# Patient Record
Sex: Female | Born: 1955
Health system: Southern US, Community
[De-identification: ages and names within clinical notes are randomized; demographics above are authoritative.]

## PROBLEM LIST (undated history)

## (undated) DIAGNOSIS — R112 Nausea with vomiting, unspecified: Secondary | ICD-10-CM

## (undated) DIAGNOSIS — Z973 Presence of spectacles and contact lenses: Secondary | ICD-10-CM

## (undated) DIAGNOSIS — C50919 Malignant neoplasm of unspecified site of unspecified female breast: Secondary | ICD-10-CM

## (undated) DIAGNOSIS — Z972 Presence of dental prosthetic device (complete) (partial): Secondary | ICD-10-CM

## (undated) DIAGNOSIS — Z9889 Other specified postprocedural states: Secondary | ICD-10-CM

## (undated) DIAGNOSIS — Z923 Personal history of irradiation: Secondary | ICD-10-CM

## (undated) HISTORY — DX: Malignant neoplasm of unspecified site of unspecified female breast: C50.919

## (undated) HISTORY — PX: MULTIPLE TOOTH EXTRACTIONS: SHX2053

---

## 2000-02-13 ENCOUNTER — Other Ambulatory Visit: Admission: RE | Admit: 2000-02-13 | Discharge: 2000-02-13 | Payer: Self-pay | Admitting: *Deleted

## 2000-02-14 ENCOUNTER — Encounter (INDEPENDENT_AMBULATORY_CARE_PROVIDER_SITE_OTHER): Payer: Self-pay | Admitting: Specialist

## 2000-02-14 ENCOUNTER — Other Ambulatory Visit: Admission: RE | Admit: 2000-02-14 | Discharge: 2000-02-14 | Payer: Self-pay | Admitting: *Deleted

## 2000-07-23 ENCOUNTER — Encounter: Payer: Self-pay | Admitting: Internal Medicine

## 2000-07-23 ENCOUNTER — Encounter: Admission: RE | Admit: 2000-07-23 | Discharge: 2000-07-23 | Payer: Self-pay | Admitting: Internal Medicine

## 2004-02-15 ENCOUNTER — Encounter: Admission: RE | Admit: 2004-02-15 | Discharge: 2004-02-15 | Payer: Self-pay | Admitting: Internal Medicine

## 2009-08-20 ENCOUNTER — Encounter: Admission: RE | Admit: 2009-08-20 | Discharge: 2009-08-20 | Payer: Self-pay | Admitting: Obstetrics & Gynecology

## 2010-07-27 ENCOUNTER — Other Ambulatory Visit: Payer: Self-pay | Admitting: Family Medicine

## 2010-07-27 DIAGNOSIS — Z1231 Encounter for screening mammogram for malignant neoplasm of breast: Secondary | ICD-10-CM

## 2010-08-24 ENCOUNTER — Ambulatory Visit
Admission: RE | Admit: 2010-08-24 | Discharge: 2010-08-24 | Disposition: A | Discharge status: Home / Self Care | Payer: 59 | Source: Ambulatory Visit | Attending: Family Medicine | Admitting: Family Medicine

## 2010-08-24 DIAGNOSIS — Z1231 Encounter for screening mammogram for malignant neoplasm of breast: Secondary | ICD-10-CM

## 2011-09-20 ENCOUNTER — Other Ambulatory Visit: Payer: Self-pay | Admitting: Family Medicine

## 2011-09-20 DIAGNOSIS — Z1231 Encounter for screening mammogram for malignant neoplasm of breast: Secondary | ICD-10-CM

## 2011-10-04 ENCOUNTER — Ambulatory Visit
Admission: RE | Admit: 2011-10-04 | Discharge: 2011-10-04 | Disposition: A | Discharge status: Home / Self Care | Payer: 59 | Source: Ambulatory Visit | Attending: Family Medicine | Admitting: Family Medicine

## 2011-10-04 DIAGNOSIS — Z1231 Encounter for screening mammogram for malignant neoplasm of breast: Secondary | ICD-10-CM

## 2012-10-03 ENCOUNTER — Other Ambulatory Visit: Payer: Self-pay

## 2012-10-03 DIAGNOSIS — Z1231 Encounter for screening mammogram for malignant neoplasm of breast: Secondary | ICD-10-CM

## 2012-10-23 ENCOUNTER — Ambulatory Visit
Admission: RE | Admit: 2012-10-23 | Discharge: 2012-10-23 | Disposition: A | Discharge status: Home / Self Care | Payer: 59 | Source: Ambulatory Visit

## 2012-10-23 DIAGNOSIS — Z1231 Encounter for screening mammogram for malignant neoplasm of breast: Secondary | ICD-10-CM

## 2013-04-16 HISTORY — PX: ORIF WRIST FRACTURE: SHX2133

## 2013-10-10 ENCOUNTER — Other Ambulatory Visit: Payer: Self-pay

## 2013-10-10 DIAGNOSIS — Z1231 Encounter for screening mammogram for malignant neoplasm of breast: Secondary | ICD-10-CM

## 2013-10-24 ENCOUNTER — Ambulatory Visit
Admission: RE | Admit: 2013-10-24 | Discharge: 2013-10-24 | Disposition: A | Discharge status: Home / Self Care | Payer: 59 | Source: Ambulatory Visit

## 2013-10-24 DIAGNOSIS — Z1231 Encounter for screening mammogram for malignant neoplasm of breast: Secondary | ICD-10-CM

## 2013-10-27 ENCOUNTER — Other Ambulatory Visit: Payer: Self-pay | Admitting: Family Medicine

## 2013-10-27 DIAGNOSIS — R928 Other abnormal and inconclusive findings on diagnostic imaging of breast: Secondary | ICD-10-CM

## 2013-11-14 ENCOUNTER — Ambulatory Visit
Admission: RE | Admit: 2013-11-14 | Discharge: 2013-11-14 | Disposition: A | Discharge status: Home / Self Care | Payer: 59 | Source: Ambulatory Visit | Attending: Family Medicine | Admitting: Family Medicine

## 2013-11-14 ENCOUNTER — Other Ambulatory Visit: Payer: Self-pay | Admitting: Family Medicine

## 2013-11-14 DIAGNOSIS — R928 Other abnormal and inconclusive findings on diagnostic imaging of breast: Secondary | ICD-10-CM

## 2013-11-24 ENCOUNTER — Ambulatory Visit
Admission: RE | Admit: 2013-11-24 | Discharge: 2013-11-24 | Disposition: A | Discharge status: Home / Self Care | Payer: 59 | Source: Ambulatory Visit | Attending: Family Medicine | Admitting: Family Medicine

## 2013-11-24 DIAGNOSIS — R928 Other abnormal and inconclusive findings on diagnostic imaging of breast: Secondary | ICD-10-CM

## 2013-11-24 HISTORY — PX: BREAST BIOPSY: SHX20

## 2013-11-25 ENCOUNTER — Other Ambulatory Visit: Payer: Self-pay | Admitting: Family Medicine

## 2013-11-25 DIAGNOSIS — C50912 Malignant neoplasm of unspecified site of left female breast: Secondary | ICD-10-CM

## 2013-11-26 ENCOUNTER — Telehealth: Payer: Self-pay | Admitting: *Deleted

## 2013-11-26 DIAGNOSIS — C50112 Malignant neoplasm of central portion of left female breast: Secondary | ICD-10-CM | POA: Insufficient documentation

## 2013-11-26 DIAGNOSIS — C50412 Malignant neoplasm of upper-outer quadrant of left female breast: Secondary | ICD-10-CM

## 2013-11-26 NOTE — Telephone Encounter (Signed)
Confirmed [facility] for 12/03/13 at 8am .  Instructions and contact information given.

## 2013-12-01 ENCOUNTER — Ambulatory Visit
Admission: RE | Admit: 2013-12-01 | Discharge: 2013-12-01 | Disposition: A | Discharge status: Home / Self Care | Payer: 59 | Source: Ambulatory Visit | Attending: Family Medicine | Admitting: Family Medicine

## 2013-12-01 DIAGNOSIS — C50912 Malignant neoplasm of unspecified site of left female breast: Secondary | ICD-10-CM

## 2013-12-01 MED ORDER — GADOBENATE DIMEGLUMINE 529 MG/ML IV SOLN
10.0000 mL | Freq: Once | INTRAVENOUS | Status: AC | PRN
  Administered 2013-12-01: 10 mL via INTRAVENOUS

## 2013-12-03 ENCOUNTER — Encounter: Payer: Self-pay | Admitting: *Deleted

## 2013-12-03 ENCOUNTER — Ambulatory Visit: Payer: 59

## 2013-12-03 ENCOUNTER — Encounter: Payer: Self-pay | Admitting: Hematology and Oncology

## 2013-12-03 ENCOUNTER — Ambulatory Visit (INDEPENDENT_AMBULATORY_CARE_PROVIDER_SITE_OTHER): Payer: Self-pay | Admitting: Surgery

## 2013-12-03 ENCOUNTER — Encounter: Payer: Self-pay | Admitting: Dietician

## 2013-12-03 ENCOUNTER — Encounter (INDEPENDENT_AMBULATORY_CARE_PROVIDER_SITE_OTHER): Payer: Self-pay

## 2013-12-03 ENCOUNTER — Ambulatory Visit: Payer: 59 | Admitting: Physical Therapy

## 2013-12-03 ENCOUNTER — Other Ambulatory Visit (HOSPITAL_BASED_OUTPATIENT_CLINIC_OR_DEPARTMENT_OTHER): Payer: 59

## 2013-12-03 ENCOUNTER — Ambulatory Visit (HOSPITAL_BASED_OUTPATIENT_CLINIC_OR_DEPARTMENT_OTHER): Payer: 59 | Admitting: Hematology and Oncology

## 2013-12-03 ENCOUNTER — Ambulatory Visit
Admission: RE | Admit: 2013-12-03 | Discharge: 2013-12-03 | Disposition: A | Discharge status: Home / Self Care | Payer: 59 | Source: Ambulatory Visit | Attending: Radiation Oncology | Admitting: Radiation Oncology

## 2013-12-03 VITALS — BP 149/72 | HR 80 | Temp 98.5°F | Resp 18 | Ht 59.0 in | Wt 119.5 lb

## 2013-12-03 DIAGNOSIS — C50112 Malignant neoplasm of central portion of left female breast: Secondary | ICD-10-CM

## 2013-12-03 DIAGNOSIS — C50912 Malignant neoplasm of unspecified site of left female breast: Secondary | ICD-10-CM

## 2013-12-03 DIAGNOSIS — D0512 Intraductal carcinoma in situ of left breast: Secondary | ICD-10-CM

## 2013-12-03 DIAGNOSIS — Z171 Estrogen receptor negative status [ER-]: Secondary | ICD-10-CM

## 2013-12-03 DIAGNOSIS — C50412 Malignant neoplasm of upper-outer quadrant of left female breast: Secondary | ICD-10-CM

## 2013-12-03 LAB — CBC WITH DIFFERENTIAL/PLATELET
BASO%: 0.7 % (ref 0.0–2.0)
Basophils Absolute: 0 10*3/uL (ref 0.0–0.1)
EOS ABS: 0.1 10*3/uL (ref 0.0–0.5)
EOS%: 3.3 % (ref 0.0–7.0)
HCT: 43.3 % (ref 34.8–46.6)
HGB: 14 g/dL (ref 11.6–15.9)
LYMPH%: 35.8 % (ref 14.0–49.7)
MCH: 27.1 pg (ref 25.1–34.0)
MCHC: 32.2 g/dL (ref 31.5–36.0)
MCV: 84 fL (ref 79.5–101.0)
MONO#: 0.4 10*3/uL (ref 0.1–0.9)
MONO%: 8.6 % (ref 0.0–14.0)
NEUT%: 51.6 % (ref 38.4–76.8)
NEUTROS ABS: 2.1 10*3/uL (ref 1.5–6.5)
PLATELETS: 247 10*3/uL (ref 145–400)
RBC: 5.16 10*6/uL (ref 3.70–5.45)
RDW: 14 % (ref 11.2–14.5)
WBC: 4.2 10*3/uL (ref 3.9–10.3)
lymph#: 1.5 10*3/uL (ref 0.9–3.3)

## 2013-12-03 LAB — COMPREHENSIVE METABOLIC PANEL (CC13)
ALBUMIN: 4 g/dL (ref 3.5–5.0)
ALK PHOS: 107 U/L (ref 40–150)
ALT: 13 U/L (ref 0–55)
AST: 16 U/L (ref 5–34)
Anion Gap: 9 mEq/L (ref 3–11)
BILIRUBIN TOTAL: 0.47 mg/dL (ref 0.20–1.20)
BUN: 15.3 mg/dL (ref 7.0–26.0)
CO2: 27 mEq/L (ref 22–29)
Calcium: 9.6 mg/dL (ref 8.4–10.4)
Chloride: 107 mEq/L (ref 98–109)
Creatinine: 0.8 mg/dL (ref 0.6–1.1)
Glucose: 153 mg/dl — ABNORMAL HIGH (ref 70–140)
POTASSIUM: 3.8 meq/L (ref 3.5–5.1)
SODIUM: 142 meq/L (ref 136–145)
TOTAL PROTEIN: 7.2 g/dL (ref 6.4–8.3)

## 2013-12-03 NOTE — Progress Notes (Addendum)
  Ms. Boettner is a very pleasant 58 y.o. 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 Biopsy results also revealed pathology indicating the tumor is ER negative and PR negative.   She presents today with her friends to the [facility]) for treatment consideration and recommendations from the breast surgeon, radiation oncologist, and medical oncologist.   I briefly met with Ms. Steines and her friends during her [facility] visit today. We discussed the purpose of the Survivorship Clinic, which will include monitoring for recurrence, coordinating completion of age and gender-appropriate cancer screenings, promotion of overall wellness, as well as managing potential late/long-term side effects of anti-cancer treatments.   As of today, the treatment plan for Ms. Salmons will likely include surgery and radiation therapy. She will also meet with the [employer]. Endocrine therapy will not be offered for Ms. Mungin due to the tumor's ER/PR status.  The intent of treatment for Ms. [place] is cure, therefore she is will be eligible for the Survivorship Clinic upon her completion of treatment. Her survivorship care plan (SCP) document will be drafted and updated throughout the course of her treatment trajectory. She will receive the SCP in an office visit with myself in the Survivorship Clinic once she has completed treatment.   Ms. Strnad was encouraged to ask questions and all questions were answered to her satisfaction. She was given my business card and encouraged to contact me with any concerns regarding survivorship. I look forward to participating in her care.  Mike Craze, NP

## 2013-12-03 NOTE — Progress Notes (Signed)
[facility] NEW PATIENT EVALUATION  Name: Kathy Sherman MRN: [id number]  Date:   12/03/2013           DOB: [birth year]  Status: outpatient   CC: Kathy Brome, MD  Kathy Overall, MD    REFERRING PHYSICIAN: Alphonsa Overall, MD   DIAGNOSIS: Stage 0 (Tis N0 M0) DCIS of the left breast   HISTORY OF PRESENT ILLNESS:  Kathy Sherman is a 58 y.o. female who is seen today at the multidisciplinary breast clinic through the courtesy of Dr. Lucia Sherman for evaluation of her DCIS of the left breast. At the time of a screening tomo mammogram at the Kathy [facility] on October 9 she was seen to have new left breast calcifications. Follow-up mammography on October 30 showed a 5 x 8 x 20 mm area of calcifications within the upper retroareolar region. Biopsy on November 9 was diagnostic for high-grade DCIS with calcifications and necrosis. Her DCIS was ER and PR negative. Breast MR on November 16 showed non-mass like enhancement over an area of 1.3 x 2.2 x 1.7 cm within the upper central breast. She is without complaints today.  PREVIOUS RADIATION THERAPY: No   PAST MEDICAL HISTORY:    PAST SURGICAL HISTORY: No past surgical history on file.   FAMILY HISTORY: Her father died from a GI bleed at age 21. Her mother died from "old age" at 64. No family history of breast cancer.   SOCIAL HISTORY:  reports that she has never smoked. She does not have any smokeless tobacco history on file. She reports that she does not drink alcohol or use illicit drugs. Married, one son age 16. She works as a Health and safety inspector.   ALLERGIES: Review of patient's allergies indicates not on file.   MEDICATIONS:  Current Outpatient Prescriptions  Medication Sig Dispense Refill  . ferrous sulfate 325 (65 FE) MG tablet Take 325 mg by mouth daily with breakfast.     No current facility-administered medications for this encounter.     REVIEW OF SYSTEMS:  Pertinent items are noted in HPI.    PHYSICAL  EXAM: Alert and oriented female appearing her stated age.   Wt Readings from Last 3 Encounters:  12/03/13 119 lb 8 oz (54.205 kg)   Temp Readings from Last 3 Encounters:  12/03/13 98.5 F (36.9 C) Oral   BP Readings from Last 3 Encounters:  12/03/13 149/72   Pulse Readings from Last 3 Encounters:  12/03/13 80   Head and neck examination: Grossly unremarkable. Nodes: Without palpable cervical, supraclavicular, or axillary lymphadenopathy. Chest: Lungs clear. Breasts: There is a punctate biopsy wound at 12:00 along the left breast with surrounding ecchymosis. There is mild induration along her biopsy site. Right breast without masses or lesions. Abdomen without hepatomegaly. Extremities: Without edema.    LABORATORY DATA:  Lab Results  Component Value Date   WBC 4.2 12/03/2013   HGB 14.0 12/03/2013   HCT 43.3 12/03/2013   MCV 84.0 12/03/2013   PLT 247 12/03/2013   Lab Results  Component Value Date   NA 142 12/03/2013   K 3.8 12/03/2013   CO2 27 12/03/2013   Lab Results  Component Value Date   ALT 13 12/03/2013   AST 16 12/03/2013   ALKPHOS 107 12/03/2013   BILITOT 0.47 12/03/2013      IMPRESSION: Stage 0 (Tis N0 M0) DCIS of the left breast. Her prognosis is excellent.  I explained to the patient and her close friends that her  local treatment options include mastectomy versus partial mastectomy followed by radiation therapy. We discussed the potential acute and late toxicities of radiation therapy. I would obtain a baseline preradiation mammogram postoperatively. She would be a candidate for deep inspiration breath-hold technology to avoid cardiac irradiation. We can also discuss hypofractionated treatment although there is some concern about treatment of high-grade disease with a hypofractionated regimen.   PLAN: As discussed above. I can see her postoperatively.  I spent 40 minutes face to face with the patient and more than 50% of that time was spent in counseling  and/or coordination of care.

## 2013-12-03 NOTE — Progress Notes (Signed)
[facility] Psychosocial Distress Screening Clinical Social Work  Patient completed distress screening protocol and scored a 5 on the Psychosocial Distress Thermometer which indicates moderate distress. Clinical Social Worker met with patient and patients son in [employer] to assess for distress and other psychosocial needs.  Patient stated she felt "better" after meeting with the treatment team and getting information on her treatment plan.  Patient also requested information on Advance Directives.  CSW and patient reviewed AD packet and patient plans to contact CSW when she is prepared to complete.  CSW informed patient of the support team and support services at [facility], and encouraged patient to call with any questions or concerns.      ONCBCN DISTRESS SCREENING 12/03/2013  Screening Type Initial Screening  Distress experienced in past week (1-10) 5  Practical problem type Transportation;Insurance  Family Problem type Other (comment)  Emotional problem type Adjusting to illness  Information Concerns Type Lack of info about diagnosis;Lack of info about treatment;Lack of info about complementary therapy choices;Lack of info about maintaining fitness  Physician notified of physical symptoms Yes  Referral to clinical social work Yes    Johnnye Lana, MSW, LCSW, OSW-C Clinical Social Worker [facility] [phone]

## 2013-12-03 NOTE — Progress Notes (Unsigned)
Patient was seen by RD during [facility] on 12/03/2013  Provided pt with folder of educational materials regarding general nutrition recommendations for breast cancer patients, plant-based diets, antioxidants, cancer facts vs myths, and information on organic foods  Explained importance of healthy nutrition during treatments and encouraged pt to consume daily recommended amount of fruits and vegetables, emphasizing variety of intake for maximum antioxidant and synergistic health benefits. Promoted adequate fiber intake, with use of whole grain and whole wheat products, beans, and lentils. Encouraged patient to follow a low fat diet with use of heart healthy fats, and to opt for plant-based proteins weekly  Recommended pt maintain healthy weight during treatments, and encouraged gradual weight loss as warranted after procedures.  Diet recall indicated pt consuming largely [place] based diet; eats variety of fruits, vegetables, fish, chicken with minimal intake of red meats or processed foods. Pt does majority of cooking at home, and rarely goes out to eat for meals. Recommended pt increase intake of plant based proteins, as well as continued intake of lean animal proteins  Patient had questions regarding organic vs non-organic foods. Explained to pt the benefits of organic, and theories as to why organic may contain higher antioxidants and phytochemicals; both of which could be helpful for pt's immune system. Recommended pt refer to "Dirty Dozen List" for purchasing guidelines.  Expect good compliance. Pt complying with general healthy nutrition prior to RD education. Pt's family was also present during education and seemed to be a very good support system.   Provided pt with outpatient oncology RD contact information. Encouraged pt to contact RD with additional follow up questions or nutrition-related concerns.  Atlee Abide MS RD LDN Clinical Dietitian EXNTZ:[phone]

## 2013-12-03 NOTE — Consult Note (Signed)
Kathy Sherman 12/03/2013 8:20 AM Location: [facility] Patient #: [id number] DOB: May 06, 1955 Undefined / Language: Undefined / Race: Undefined Female  History of Present Illness   The patient is a 58 year old female who presents with breast cancer. Her PCP is Dr. Leonarda Sherman, [place]. She lives in [place].  She is in the [facility]. Dr. Lindi Sherman and Kathy Sherman are her treating oncologist.  She is accompanied with Kathy [place] and Kathy Sherman (both female friends). Her husband is in the [facility] ICU after a biopsy of his renal transplant.  Her back ground is Kathy Sherman. I asked her how she got here - meaning how they found her breast cancer. She interpreted it as how she got from the [place] to the [place].  She gets annual mammogram, last one was about Sept 2014. She had a mammogram on 10/24/2013 at [place] that showed a left breast microcalcifications. On 11/14/2013 she had a mammogram that showed 5 x 8 x 20 mm loose group of heterogeneous calcifications. A left breast biopsy on 11/24/2013 [phone]) showed DCIS, ER - 0, PR - 0.  She has no family history of breast cancer. She is not on hormone meds. Her last period was about 6 years ago.  Her MRI on 12/01/2013 showed a 2.2 cm area of enhancement in the upper central portion of the left breast. She had an incidental right renal cyst (2.0 cm).  I discussed the options for breast cancer treatment with the patient. She is in the [facility] and is seeing medical oncology and radiation oncology. I discussed the surgical options of lumpectomy vs. mastectomy. If mastectomy, there is the possibility of reconstruction. I discussed the options of lymph node biopsy. The treatment plan depends on the pathologic staging of the tumor and the patient's personal wishes. The risks of surgery include, but are not limited to, bleeding, infection, the need for further surgery, and nerve injury. The patient has been  given literature on the treatment of breast cancer.  Plan: 1) Left breast lumpectomy (seed localization), 2) radiation therapy  Past medical history: She fractured her left wrist in April 2015. Required surgery by Dr. Celesta Sherman in [place].  Social History: Married. Husband in ICU at [facility]. He has had a renal transplant. She has one son, age 34, at [employer]. 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  Other Problems Kathy Sherman; 12/03/2013 8:21 AM) Breast Cancer General anesthesia - complications Hypercholesterolemia Lump In Breast  Past Surgical History Kathy Sherman; 12/03/2013 8:21 AM) Breast Biopsy Left. Oral Surgery  Diagnostic Studies History Kathy Sherman; 12/03/2013 8:21 AM) Colonoscopy never Mammogram within last year Pap Smear 1-5 years ago  Social History Kathy Sherman; 12/03/2013 8:21 AM) Caffeine use Carbonated beverages, Coffee. No alcohol use No drug use Tobacco use Never smoker.  Family History Kathy Sherman; 12/03/2013 8:21 AM) Bleeding disorder Father. Hypertension Brother.  Pregnancy / Birth History Kathy Sherman; 12/03/2013 8:21 AM) Age at menarche 64 years. Age of menopause 3-55 Gravida 1 Irregular periods Maternal age 68-35 Para 1  Review of Systems Kathy Sherman; 12/03/2013 8:21 AM) General Present- Weight Loss. Not Present- Appetite Loss, Chills, Fatigue, Fever, Night Sweats and Weight Gain. Skin Not Present- Change in Wart/Mole, Dryness, Hives, Jaundice, New Lesions, Non-Healing Wounds, Rash and Ulcer. HEENT Present- Seasonal Allergies and Wears glasses/contact lenses. Not Present- Earache, Hearing Loss, Hoarseness, Nose Bleed, Oral Ulcers, Ringing in the Ears, Sinus Pain, Sore Throat, Visual Disturbances and Yellow Eyes. Respiratory Present- Snoring.  Not Present- Bloody sputum, Chronic Cough, Difficulty Breathing and Wheezing. Breast Not Present- Breast Mass, Breast Pain, Nipple  Discharge and Skin Changes. Cardiovascular Not Present- Chest Pain, Difficulty Breathing Lying Down, Leg Cramps, Palpitations, Rapid Heart Rate, Shortness of Breath and Swelling of Extremities. Gastrointestinal Not Present- Abdominal Pain, Bloating, Bloody Stool, Change in Bowel Habits, Chronic diarrhea, Constipation, Difficulty Swallowing, Excessive gas, Gets full quickly at meals, Hemorrhoids, Indigestion, Nausea, Rectal Pain and Vomiting. Female Genitourinary Not Present- Frequency, Nocturia, Painful Urination, Pelvic Pain and Urgency. Musculoskeletal Not Present- Back Pain, Joint Pain, Joint Stiffness, Muscle Pain, Muscle Weakness and Swelling of Extremities. Neurological Not Present- Decreased Memory, Fainting, Headaches, Numbness, Seizures, Tingling, Tremor, Trouble walking and Weakness. Psychiatric Not Present- Anxiety, Bipolar, Change in Sleep Pattern, Depression, Fearful and Frequent crying. Endocrine Not Present- Cold Intolerance, Excessive Hunger, Hair Changes, Heat Intolerance, Hot flashes and New Diabetes. Hematology Not Present- Easy Bruising, Excessive bleeding, Gland problems, HIV and Persistent Infections.  Physical Exam: Note:General: WN Kathy Sherman F alert and generally healthy appearing. HEENT: Normal. Pupils equal. Good dentition.  Neck: Supple. No mass. No thyroid mass.  Lymph Nodes: No supraclavicular, cervical, or axillary nodes.  Lungs: Clear to auscultation and symmetric breath sounds. Heart: RRR. No murmur or rub.  Breasts: Right - No mass or nipple discharge Left - Bruise at 12 o'clock. Minimal mass effect, probably from buise. No nipple discharge.  Abdomen: Soft. No mass. No tenderness. No hernia. Normal bowel sounds. No abdominal scars. Rectal: Not done.  Extremities: Good strength and ROM in upper and lower extremities. Scar left wrist from surgery.  Neurologic: Grossly intact to motor and sensory function. Psychiatric: Has normal mood and  affect. Behavior is normal.   Assessment & Plan: DCIS (DUCTAL CARCINOMA IN SITU) OF BREAST, LEFT (233.0  D05.12) Story: Microca++ at 12 o'clock  Left breast biopsy on 11/24/2013 ([id number]) showed DCIS, ER - 0, PR - 0. Impression: Plan: 1) Seed localization left breast lumpectomy, 2) Radiation tx  Current Plans:  Schedule for Surgery  Alphonsa Overall, MD, [facility] Pager: [phone] Office phone:  [phone]

## 2013-12-03 NOTE — Progress Notes (Deleted)
Ms. Hild is a very pleasant 58 y.o.Marland Kitchen female from [place], [place] with newly diagnosed grade 3 invasive ductal carcinoma in situ of the left breast.  Biopsy results also revealed pathology indicating the tumor is ER negative and PR negative.   She presents today with her family to the [facility]) for treatment consideration and recommendations from the breast surgeon, radiation oncologist, and medical oncologist.     I briefly met with Ms. Pardi and her family during her [facility] visit today. We discussed the purpose of the Survivorship Clinic, which will include monitoring for recurrence, coordinating completion of age and gender-appropriate cancer screenings, promotion of overall wellness, as well as managing potential late/long-term side effects of anti-cancer treatments.    As of today, the treatment plan for Ms. Dilmore will likely include surgery and radiation therapy.  She will also meet with the [employer]. Endocrine therapy with will also be considered as well.The intent of treatment for Ms. Boulos is cure, therefore she is will be eligible for the Survivorship Clinic upon her completion of treatment.  Her survivorship care plan (SCP) document has been drafted and will be updated throughout the course of her treatment trajectory.  She will receive the SCP in an office visit with myself in the Survivorship Clinic once she has completed treatment.   Ms. Harrigan was encouraged to ask questions and all questions were answered to her satisfaction.  She was given my business card and encouraged to contact me with any concerns regarding survivorship.  I look forward to  participating in her care.  Mike Craze, NP

## 2013-12-03 NOTE — Progress Notes (Signed)
Checked in new pt with no financial concerns at this time.  Informed pt that if chemo is part of her treatment I would call her ins to see if Kathy Sherman is req and will obtain it if it is needed also informed her that I will contact foundations that offer copay assistance for chemo if needed.  She has my card for any questions or concerns she may have in the future.

## 2013-12-03 NOTE — Progress Notes (Signed)
[facility] CONSULT NOTE  Patient Care Team: [name], MD as PCP - General (Family Medicine) Rulon Eisenmenger, MD as Consulting Physician (Hematology and Oncology) Alphonsa Overall, MD as Consulting Physician (General Surgery) Rexene Edison, MD as Consulting Physician (Radiation Oncology)  CHIEF COMPLAINTS/PURPOSE OF CONSULTATION:  Newly diagnosed DCIS  HISTORY OF PRESENTING ILLNESS:  Kathy Sherman 58 y.o. female is here because of recent diagnosis of left breast DCIS. She had a routine screening mammogram which she does every year. Today's mammogram showed calcifications. This led to a biopsy in the 9 2015. This revealed DCIS that was ER/PR negative. She underwent an MRI of the breast. She was presented this morning in the multidisciplinary tumor board. She is here today at [facility] clinic to discuss treatment plan. She denies any pain or nodules are palpable masses.   I reviewed her records extensively and collaborated the history with the patient.  SUMMARY OF ONCOLOGIC HISTORY:   Cancer of central portion of left female breast   11/24/2013 Initial Biopsy DCIS with calcifications and necrosis ER 0%, PR 0%   12/01/2013 Breast MRI Left breast upper central portion 2.2 cm enhancement; incidental note of right renal cyst 2 cm    In terms of breast cancer risk profile:  She menarched at early age of 57  She had 1 pregnancy, her first child was born at age 27  She has not received birth control pills.  She was never exposed to fertility medications or hormone replacement therapy.  She has no family history of Breast/GYN/GI cancer  MEDICAL HISTORY:  Past Medical History  Diagnosis Date  . Breast cancer     SURGICAL HISTORY: History reviewed. No pertinent past surgical history.  SOCIAL HISTORY: History   Social History  . Marital Status: Married    Spouse Name: N/A    Number of Children: N/A  . Years of Education: N/A   Occupational History  . Not on file.    Social History Main Topics  . Smoking status: Never Smoker   . Smokeless tobacco: Not on file  . Alcohol Use: No  . Drug Use: No  . Sexual Activity: Not on file   Other Topics Concern  . Not on file   Social History Narrative  . No narrative on file    FAMILY HISTORY: History reviewed. No pertinent family history.  ALLERGIES:  has no allergies on file.  MEDICATIONS:  Current Outpatient Prescriptions  Medication Sig Dispense Refill  . ferrous sulfate 325 (65 FE) MG tablet Take 325 mg by mouth daily with breakfast.     No current facility-administered medications for this visit.    REVIEW OF SYSTEMS:   Constitutional: Denies fevers, chills or abnormal night sweats Eyes: Denies blurriness of vision, double vision or watery eyes Ears, nose, mouth, throat, and face: Denies mucositis or sore throat Respiratory: Denies cough, dyspnea or wheezes Cardiovascular: Denies palpitation, chest discomfort or lower extremity swelling Gastrointestinal:  Denies nausea, heartburn or change in bowel habits Skin: Denies abnormal skin rashes Lymphatics: Denies new lymphadenopathy or easy bruising Neurological:Denies numbness, tingling or new weaknesses Behavioral/Psych: Mood is stable, no new changes  Breast:  Denies any palpable lumps or discharge All other systems were reviewed with the patient and are negative.  PHYSICAL EXAMINATION: ECOG PERFORMANCE STATUS: 0 - Asymptomatic  Filed Vitals:   12/03/13 0850  BP: 149/72  Pulse: 80  Temp: 98.5 F (36.9 C)  Resp: 18   Filed Weights   12/03/13 0850  Weight: 119 lb 8 oz (54.205 kg)    GENERAL:alert, no distress and comfortable SKIN: skin color, texture, turgor are normal, no rashes or significant lesions EYES: normal, conjunctiva are pink and non-injected, sclera clear OROPHARYNX:no exudate, no erythema and lips, buccal mucosa, and tongue normal  NECK: supple, thyroid normal size, non-tender, without nodularity LYMPH:  no  palpable lymphadenopathy in the cervical, axillary or inguinal LUNGS: clear to auscultation and percussion with normal breathing effort HEART: regular rate & rhythm and no murmurs and no lower extremity edema ABDOMEN:abdomen soft, non-tender and normal bowel sounds Musculoskeletal:no cyanosis of digits and no clubbing  PSYCH: alert & oriented x 3 with fluent speech NEURO: no focal motor/sensory deficits BREAST: No palpable nodules in breast. No palpable axillary or supraclavicular lymphadenopathy  LABORATORY DATA:  I have reviewed the data as listed Lab Results  Component Value Date   WBC 4.2 12/03/2013   HGB 14.0 12/03/2013   HCT 43.3 12/03/2013   MCV 84.0 12/03/2013   PLT 247 12/03/2013   Lab Results  Component Value Date   NA 142 12/03/2013   K 3.8 12/03/2013   CO2 27 12/03/2013    RADIOGRAPHIC STUDIES: I have personally reviewed the radiological reports and agreed with the findings in the report. Results are summarized above  ASSESSMENT AND PLAN:  Cancer of central portion of left female breast Left breast DCIS with calcifications: By mammogram and ultrasound 58 mm in the right retroareolar area by MRI 1.3 x 2.2 x 1.7 cm ER negative PR negative  Pathology and radiology review: I discussed the patient extensively the results of radiology test as well as pathology report. I described the difference between DCIS and invasive ductal carcinoma. I also discussed the fact that she is ER/PR negative. And hence she would not benefit from antiestrogen therapy.  Recommendation: Surgery followed by adjuvant radiation therapy.  Patient's husband is in the intensive care unit at [facility]. He a kidney transplant and a kidney biopsy led to severe bleeding. Patient is very concerned about his well-being. But she wants to continue with the current treatment plan regarding her DCIS. She is accompanied today by 2 of her friends.     Rulon Eisenmenger, MD 12/03/2013 11:57 AM

## 2013-12-03 NOTE — Assessment & Plan Note (Signed)
Left breast DCIS with calcifications: By mammogram and ultrasound 58 mm in the right retroareolar area by MRI 1.3 x 2.2 x 1.7 cm ER negative PR negative  Pathology and radiology review: I discussed the patient extensively the results of radiology test as well as pathology report. I described the difference between DCIS and invasive ductal carcinoma. I also discussed the fact that she is ER/PR negative. And hence she would not benefit from antiestrogen therapy.  Recommendation: Surgery followed by adjuvant radiation therapy.  Patient's husband is in the intensive care unit at [facility]. He a kidney transplant and a kidney biopsy led to severe bleeding. Patient is very concerned about his well-being. But she wants to continue with the current treatment plan regarding her DCIS. She is accompanied today by 2 of her friends.

## 2013-12-04 ENCOUNTER — Other Ambulatory Visit (INDEPENDENT_AMBULATORY_CARE_PROVIDER_SITE_OTHER): Payer: Self-pay | Admitting: Surgery

## 2013-12-04 DIAGNOSIS — C50912 Malignant neoplasm of unspecified site of left female breast: Secondary | ICD-10-CM

## 2013-12-08 ENCOUNTER — Telehealth: Payer: Self-pay | Admitting: *Deleted

## 2013-12-08 NOTE — Telephone Encounter (Signed)
Left message for a return phone call from [facility] 12/03/13. Awaiting patient response.

## 2013-12-16 ENCOUNTER — Encounter (HOSPITAL_BASED_OUTPATIENT_CLINIC_OR_DEPARTMENT_OTHER): Payer: Self-pay | Admitting: *Deleted

## 2013-12-16 NOTE — Progress Notes (Signed)
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

## 2013-12-18 ENCOUNTER — Ambulatory Visit
Admission: RE | Admit: 2013-12-18 | Discharge: 2013-12-18 | Disposition: A | Discharge status: Home / Self Care | Payer: 59 | Source: Ambulatory Visit | Attending: Surgery | Admitting: Surgery

## 2013-12-18 DIAGNOSIS — C50912 Malignant neoplasm of unspecified site of left female breast: Secondary | ICD-10-CM

## 2013-12-19 ENCOUNTER — Encounter (HOSPITAL_BASED_OUTPATIENT_CLINIC_OR_DEPARTMENT_OTHER): Payer: Self-pay | Admitting: *Deleted

## 2013-12-19 ENCOUNTER — Ambulatory Visit
Admission: RE | Admit: 2013-12-19 | Discharge: 2013-12-19 | Disposition: A | Discharge status: Home / Self Care | Payer: 59 | Source: Ambulatory Visit | Attending: Surgery | Admitting: Surgery

## 2013-12-19 ENCOUNTER — Encounter (HOSPITAL_BASED_OUTPATIENT_CLINIC_OR_DEPARTMENT_OTHER)
Admission: RE | Disposition: A | Discharge status: Home / Self Care | Payer: Self-pay | Source: Ambulatory Visit | Attending: Surgery

## 2013-12-19 ENCOUNTER — Ambulatory Visit (HOSPITAL_BASED_OUTPATIENT_CLINIC_OR_DEPARTMENT_OTHER): Payer: 59 | Admitting: Anesthesiology

## 2013-12-19 ENCOUNTER — Ambulatory Visit (HOSPITAL_BASED_OUTPATIENT_CLINIC_OR_DEPARTMENT_OTHER)
Admission: RE | Admit: 2013-12-19 | Discharge: 2013-12-19 | Disposition: A | Discharge status: Home / Self Care | Payer: 59 | Source: Ambulatory Visit | Attending: Surgery | Admitting: Surgery

## 2013-12-19 DIAGNOSIS — Z940 Kidney transplant status: Secondary | ICD-10-CM | POA: Diagnosis not present

## 2013-12-19 DIAGNOSIS — N6012 Diffuse cystic mastopathy of left breast: Secondary | ICD-10-CM | POA: Insufficient documentation

## 2013-12-19 DIAGNOSIS — E780 Pure hypercholesterolemia: Secondary | ICD-10-CM | POA: Insufficient documentation

## 2013-12-19 DIAGNOSIS — C50912 Malignant neoplasm of unspecified site of left female breast: Secondary | ICD-10-CM | POA: Diagnosis present

## 2013-12-19 HISTORY — DX: Other specified postprocedural states: Z98.890

## 2013-12-19 HISTORY — DX: Presence of spectacles and contact lenses: Z97.3

## 2013-12-19 HISTORY — DX: Presence of dental prosthetic device (complete) (partial): Z97.2

## 2013-12-19 HISTORY — PX: BREAST LUMPECTOMY: SHX2

## 2013-12-19 HISTORY — PX: BREAST LUMPECTOMY WITH RADIOACTIVE SEED LOCALIZATION: SHX6424

## 2013-12-19 HISTORY — DX: Other specified postprocedural states: R11.2

## 2013-12-19 SURGERY — BREAST LUMPECTOMY WITH RADIOACTIVE SEED LOCALIZATION
Anesthesia: General | Site: Breast | Laterality: Left

## 2013-12-19 MED ORDER — BUPIVACAINE HCL (PF) 0.25 % IJ SOLN
INTRAMUSCULAR | Status: AC
  Filled 2013-12-19: qty 30

## 2013-12-19 MED ORDER — FENTANYL CITRATE 0.05 MG/ML IJ SOLN
INTRAMUSCULAR | Status: AC
  Filled 2013-12-19: qty 4

## 2013-12-19 MED ORDER — FENTANYL CITRATE 0.05 MG/ML IJ SOLN
INTRAMUSCULAR | Status: DC | PRN
  Administered 2013-12-19 (×2): 50 ug via INTRAVENOUS

## 2013-12-19 MED ORDER — HYDROCODONE-ACETAMINOPHEN 5-325 MG PO TABS: 1.0000 | ORAL_TABLET | Freq: Four times a day (QID) | ORAL | Status: DC | PRN

## 2013-12-19 MED ORDER — CHLORHEXIDINE GLUCONATE 4 % EX LIQD
1.0000 | Freq: Once | CUTANEOUS | Status: DC
Start: 2013-12-20 — End: 2013-12-19

## 2013-12-19 MED ORDER — CHLORHEXIDINE GLUCONATE 4 % EX LIQD: 1.0000 "application " | Freq: Once | CUTANEOUS | Status: DC

## 2013-12-19 MED ORDER — DEXAMETHASONE SODIUM PHOSPHATE 4 MG/ML IJ SOLN
INTRAMUSCULAR | Status: DC | PRN
  Administered 2013-12-19: 10 mg via INTRAVENOUS

## 2013-12-19 MED ORDER — FENTANYL CITRATE 0.05 MG/ML IJ SOLN
INTRAMUSCULAR | Status: AC
  Filled 2013-12-19: qty 2

## 2013-12-19 MED ORDER — ONDANSETRON HCL 4 MG/2ML IJ SOLN
INTRAMUSCULAR | Status: DC | PRN
  Administered 2013-12-19: 4 mg via INTRAVENOUS

## 2013-12-19 MED ORDER — MIDAZOLAM HCL 2 MG/2ML IJ SOLN: 1.0000 mg | INTRAMUSCULAR | Status: DC | PRN

## 2013-12-19 MED ORDER — LACTATED RINGERS IV SOLN
INTRAVENOUS | Status: DC
  Administered 2013-12-19 (×2): via INTRAVENOUS

## 2013-12-19 MED ORDER — MIDAZOLAM HCL 2 MG/2ML IJ SOLN
INTRAMUSCULAR | Status: AC
  Filled 2013-12-19: qty 2

## 2013-12-19 MED ORDER — CEFAZOLIN SODIUM-DEXTROSE 2-3 GM-% IV SOLR
2.0000 g | INTRAVENOUS | Status: AC
  Administered 2013-12-19: 2 g via INTRAVENOUS

## 2013-12-19 MED ORDER — CEFAZOLIN SODIUM-DEXTROSE 2-3 GM-% IV SOLR
INTRAVENOUS | Status: AC
  Filled 2013-12-19: qty 50

## 2013-12-19 MED ORDER — BUPIVACAINE-EPINEPHRINE (PF) 0.5% -1:200000 IJ SOLN
INTRAMUSCULAR | Status: DC | PRN
  Administered 2013-12-19: 25 mL

## 2013-12-19 MED ORDER — LIDOCAINE HCL (CARDIAC) 20 MG/ML IV SOLN
INTRAVENOUS | Status: DC | PRN
  Administered 2013-12-19: 50 mg via INTRAVENOUS

## 2013-12-19 MED ORDER — PROPOFOL 10 MG/ML IV BOLUS
INTRAVENOUS | Status: AC
  Filled 2013-12-19: qty 20

## 2013-12-19 MED ORDER — EPHEDRINE SULFATE 50 MG/ML IJ SOLN
INTRAMUSCULAR | Status: DC | PRN
  Administered 2013-12-19: 10 mg via INTRAVENOUS

## 2013-12-19 MED ORDER — MIDAZOLAM HCL 5 MG/5ML IJ SOLN
INTRAMUSCULAR | Status: DC | PRN
  Administered 2013-12-19: 2 mg via INTRAVENOUS

## 2013-12-19 MED ORDER — PROPOFOL 10 MG/ML IV BOLUS
INTRAVENOUS | Status: DC | PRN
  Administered 2013-12-19: 200 mg via INTRAVENOUS

## 2013-12-19 MED ORDER — FENTANYL CITRATE 0.05 MG/ML IJ SOLN: 50.0000 ug | INTRAMUSCULAR | Status: DC | PRN

## 2013-12-19 SURGICAL SUPPLY — 52 items
BENZOIN TINCTURE PRP APPL 2/3 (GAUZE/BANDAGES/DRESSINGS) IMPLANT
BINDER BREAST LRG (GAUZE/BANDAGES/DRESSINGS) IMPLANT
BINDER BREAST MEDIUM (GAUZE/BANDAGES/DRESSINGS) ×2 IMPLANT
BINDER BREAST XLRG (GAUZE/BANDAGES/DRESSINGS) IMPLANT
BINDER BREAST XXLRG (GAUZE/BANDAGES/DRESSINGS) IMPLANT
BLADE HEX COATED 2.75 (ELECTRODE) IMPLANT
BLADE SURG 10 STRL SS (BLADE) ×2 IMPLANT
BLADE SURG 15 STRL LF DISP TIS (BLADE) ×1 IMPLANT
BLADE SURG 15 STRL SS (BLADE) ×1
CANISTER SUC SOCK COL 7IN (MISCELLANEOUS) IMPLANT
CANISTER SUCT 1200ML W/VALVE (MISCELLANEOUS) ×2 IMPLANT
CHLORAPREP W/TINT 26ML (MISCELLANEOUS) ×2 IMPLANT
CLIP TI WIDE RED SMALL 6 (CLIP) ×2 IMPLANT
COVER BACK TABLE 60X90IN (DRAPES) ×2 IMPLANT
COVER MAYO STAND STRL (DRAPES) ×2 IMPLANT
COVER PROBE W GEL 5X96 (DRAPES) ×2 IMPLANT
DECANTER SPIKE VIAL GLASS SM (MISCELLANEOUS) IMPLANT
DEVICE DUBIN W/COMP PLATE 8390 (MISCELLANEOUS) ×2 IMPLANT
DRAPE PED LAPAROTOMY (DRAPES) ×2 IMPLANT
DRAPE UTILITY XL STRL (DRAPES) ×2 IMPLANT
DRSG PAD ABDOMINAL 8X10 ST (GAUZE/BANDAGES/DRESSINGS) IMPLANT
ELECT COATED BLADE 2.86 ST (ELECTRODE) ×2 IMPLANT
ELECT REM PT RETURN 9FT ADLT (ELECTROSURGICAL) ×2
ELECTRODE REM PT RTRN 9FT ADLT (ELECTROSURGICAL) ×1 IMPLANT
GLOVE BIOGEL PI IND STRL 6.5 (GLOVE) ×1 IMPLANT
GLOVE BIOGEL PI INDICATOR 6.5 (GLOVE) ×1
GLOVE ECLIPSE 6.5 STRL STRAW (GLOVE) ×2 IMPLANT
GLOVE EXAM NITRILE EXT CUFF MD (GLOVE) ×2 IMPLANT
GLOVE SURG SIGNA 7.5 PF LTX (GLOVE) ×4 IMPLANT
GOWN STRL REUS W/ TWL LRG LVL3 (GOWN DISPOSABLE) ×1 IMPLANT
GOWN STRL REUS W/ TWL XL LVL3 (GOWN DISPOSABLE) ×1 IMPLANT
GOWN STRL REUS W/TWL LRG LVL3 (GOWN DISPOSABLE) ×1
GOWN STRL REUS W/TWL XL LVL3 (GOWN DISPOSABLE) ×1
KIT MARKER MARGIN INK (KITS) ×2 IMPLANT
LIQUID BAND (GAUZE/BANDAGES/DRESSINGS) ×2 IMPLANT
NEEDLE HYPO 25X1 1.5 SAFETY (NEEDLE) ×2 IMPLANT
NS IRRIG 1000ML POUR BTL (IV SOLUTION) ×2 IMPLANT
PACK BASIN DAY SURGERY FS (CUSTOM PROCEDURE TRAY) ×2 IMPLANT
PENCIL BUTTON HOLSTER BLD 10FT (ELECTRODE) ×2 IMPLANT
PIN SAFETY STERILE (MISCELLANEOUS) IMPLANT
SHEET MEDIUM DRAPE 40X70 STRL (DRAPES) IMPLANT
SLEEVE SCD COMPRESS KNEE MED (MISCELLANEOUS) ×2 IMPLANT
SPONGE GAUZE 4X4 12PLY STER LF (GAUZE/BANDAGES/DRESSINGS) IMPLANT
SPONGE LAP 18X18 X RAY DECT (DISPOSABLE) ×2 IMPLANT
STRIP CLOSURE SKIN 1/4X4 (GAUZE/BANDAGES/DRESSINGS) IMPLANT
SUT MON AB 5-0 PS2 18 (SUTURE) ×2 IMPLANT
SUT VICRYL 3-0 CR8 SH (SUTURE) ×2 IMPLANT
SYR CONTROL 10ML LL (SYRINGE) ×2 IMPLANT
TOWEL OR 17X24 6PK STRL BLUE (TOWEL DISPOSABLE) ×2 IMPLANT
TOWEL OR NON WOVEN STRL DISP B (DISPOSABLE) IMPLANT
TUBE CONNECTING 20X1/4 (TUBING) ×2 IMPLANT
YANKAUER SUCT BULB TIP NO VENT (SUCTIONS) ×2 IMPLANT

## 2013-12-19 NOTE — Discharge Instructions (Signed)
°  Post Anesthesia Home Care Instructions  Activity: Get plenty of rest for the remainder of the day. A responsible adult should stay with you for 24 hours following the procedure.  For the next 24 hours, DO NOT: -Drive a car -Paediatric nurse -Drink alcoholic beverages -Take any medication unless instructed by your physician -Make any legal decisions or sign important papers.  Meals: Start with liquid foods such as gelatin or soup. Progress to regular foods as tolerated. Avoid greasy, spicy, heavy foods. If nausea and/or vomiting occur, drink only clear liquids until the nausea and/or vomiting subsides. Call your physician if vomiting continues.  Special Instructions/Symptoms: Your throat may feel dry or sore from the anesthesia or the breathing tube placed in your throat during surgery. If this causes discomfort, gargle with warm salt water. The discomfort should disappear within 24 hours.  [facility] - DISCHARGE INSTRUCTIONS TO PATIENT  Activity:  Driving -  You may drive in 1 to 3 days, as long as you are doing well.   Lifting - Take it easy for 3 days, then no limit.  Wound Care:   Leave wound dry for 48 hours, they you may shower.  Diet:  As tolerated.  Follow up appointment:  Call Dr. Pollie Friar office [facility]) at [phone] for an appointment in about 12 days.  Medications and dosages:  Resume your home medications.  You have a prescription for:  Vicodin  Call Dr. Lucia Gaskins or his office  [phone]) if you have:  Temperature greater than 100.4,  Persistent nausea and vomiting,  Severe uncontrolled pain,  Redness, tenderness, or signs of infection (pain, swelling, redness, odor or green/yellow discharge around the site),  Difficulty breathing, headache or visual disturbances,  Any other questions or concerns you may have after discharge.  In an emergency, call 911 or go to an Emergency Department at a nearby hospital.

## 2013-12-19 NOTE — Anesthesia Procedure Notes (Signed)
Procedure Name: LMA Insertion Date/Time: 12/19/2013 2:54 PM Performed by: Lyndee Leo Pre-anesthesia Checklist: Patient identified, Emergency Drugs available, Suction available and Patient being monitored Patient Re-evaluated:Patient Re-evaluated prior to inductionOxygen Delivery Method: Circle System Utilized Preoxygenation: Pre-oxygenation with 100% oxygen Intubation Type: IV induction Ventilation: Mask ventilation without difficulty LMA: LMA inserted LMA Size: 3.0 Number of attempts: 1 Airway Equipment and Method: bite block Placement Confirmation: positive ETCO2 Tube secured with: Tape Dental Injury: Teeth and Oropharynx as per pre-operative assessment

## 2013-12-19 NOTE — Transfer of Care (Signed)
Immediate Anesthesia Transfer of Care Note  Patient: Kathy Sherman  Procedure(s) Performed: Procedure(s): LEFT BREAST LUMPECTOMY WITH RADIOACTIVE SEED LOCALIZATION (Left)  Patient Location: PACU  Anesthesia Type:General  Level of Consciousness: sedated  Airway & Oxygen Therapy: Patient Spontanous Breathing and Patient connected to face mask oxygen  Post-op Assessment: Report given to PACU RN and Post -op Vital signs reviewed and stable  Post vital signs: Reviewed and stable  Complications: No apparent anesthesia complications

## 2013-12-19 NOTE — Interval H&P Note (Signed)
History and Physical Interval Note:  12/19/2013 2:42 PM  Kathy Sherman  has presented today for surgery, with the diagnosis of left breast cancer  The various methods of treatment have been discussed with the patient and family.  Her husband is still in the hospital at [facility].  A friend Broadus John has brought to the CDS today.  After consideration of risks, benefits and other options for treatment, the patient has consented to  Procedure(s): LEFT BREAST LUMPECTOMY WITH RADIOACTIVE SEED LOCALIZATION (Left) as a surgical intervention .  The patient's history has been reviewed, patient examined, no change in status, stable for surgery.  I have reviewed the patient's chart and labs.  Questions were answered to the patient's satisfaction.     Rose Hegner H

## 2013-12-19 NOTE — Anesthesia Preprocedure Evaluation (Signed)
Anesthesia Evaluation   Patient awake    Reviewed: Allergy & Precautions, H&P , NPO status , Patient's Chart, lab work & pertinent test results  History of Anesthesia Complications (+) PONV  Airway Mallampati: I  TM Distance: >3 FB Neck ROM: Full    Dental   Pulmonary          Cardiovascular     Neuro/Psych    GI/Hepatic   Endo/Other    Renal/GU      Musculoskeletal   Abdominal   Peds  Hematology   Anesthesia Other Findings   Reproductive/Obstetrics                             Anesthesia Physical Anesthesia Plan  ASA: II  Anesthesia Plan: General   Post-op Pain Management:    Induction: Intravenous  Airway Management Planned: LMA  Additional Equipment:   Intra-op Plan:   Post-operative Plan: Extubation in OR  Informed Consent: I have reviewed the patients History and Physical, chart, labs and discussed the procedure including the risks, benefits and alternatives for the proposed anesthesia with the patient or authorized representative who has indicated his/her understanding and acceptance.     Plan Discussed with: CRNA and Surgeon  Anesthesia Plan Comments:         Anesthesia Quick Evaluation

## 2013-12-19 NOTE — Op Note (Signed)
12/19/2013  3:47 PM  PATIENT:  Kathy Sherman DOB: 1955-07-10 MRN: [id number]   PREOP DIAGNOSIS:  left breast cancer  POSTOP DIAGNOSIS:   Left breast cancer , 12 o'clock position (Tis, N0)  PROCEDURE:   Procedure(s):LEFT BREAST LUMPECTOMY WITH RADIOACTIVE SEED LOCALIZATION  SURGEON:   Alphonsa Overall, M.D.  ANESTHESIA:   general  Anesthesiologist: Lillia Abed, MD CRNA: Lyndee Leo, CRNA; Maryella Shivers, CRNA  General  EBL:  50  ml  DRAINS: none   LOCAL MEDICATIONS USED:   25 cc 1/4% marcaine  SPECIMEN:   Left breast biopsy (suture medial), superior margin left breast biopsy (suture medial)  COUNTS CORRECT:  YES  INDICATIONS FOR PROCEDURE:  Kathy Sherman is a 58 y.o. (DOB: December 31, 1955) [place]  female whose primary care physician is COX,KIRSTEN, MD and comes for left breast lumpectomy.   She had a biopsy of her left breast which showed DCIS.  She was seen at the breast multidisciplinary clinic and has decided on a left breast lumpectomy.  Her treating oncologist are Drs. Malta.   The options for breast cancer treatment have been discussed with the patient. She elected to proceed with lumpectomy.  At this time, there is no need for a sentinel lymph node biopsy.    The indications and potential complications of surgery were explained to the patient. Potential complications include, but are not limited to, bleeding, infection, the need for further surgery, and nerve injury.     She had a I131 seed placed on 12/18/2013 in her left breast at [facility].  I confirmed the presence of the I131 seed in the pre op area using the Neoprobe.  The seed is in the 12 o'clock position of the left breast.     OPERATIVE NOTE:   The patient was taken to room # 8 at [facility] where she underwent a general anesthesia  supervised by Anesthesiologist: Lillia Abed, MD CRNA: Lyndee Leo, CRNA; Maryella Shivers, CRNA. Her left breast and axilla were prepped with  ChloraPrep  and sterilely draped.    A time-out and the surgical check list was reviewed.    I turned attention to the cancer which was about at the 12 o'clock position of the left breast.   It was only about 1.0 cm off the edge of the areola.    I used the Neoprobe to identify the I131 seed.  I tried to excise an area around the tumor of at least 1 cm.    I excised this block of breast tissue approximately 4 cm by 4 cm  in diameter.   I painted the lumpectomy specimen with the 6 color paint kit and did a specimen mammogram which confirmed the mass, clip, and the seed were all in the right position and the specimen.  The specimen was sent to pathology who called back to confirm that they have the seed and the specimen.   I did take an additional breast biopsy of the superior margin.  This was painted with two colors and a suture marked the medial aspect of the specimen   I then irrigated the wound with saline. I infiltrated approximately 25 mL of 1% local between the  Incisions.  I placed 6 clips to mark biopsy cavity, at 12, 3, 6, and 9 o'clock. Two clips were placed on the pectoralis major.   I then closed the wound in layers using 3-0 Vicryl sutures for the deep layer. At the  skin, I closed the incisions with a 5-0 Monocryl suture. The incision was then painted with Dermabond.  She had gauze place over the wounds and placed in a breast binder.   The patient tolerated the procedure well, was transported to the recovery room in good condition. Sponge and needle count were correct at the end of the case.   Final pathology is pending.   Alphonsa Overall, MD, [facility] Pager: [phone] Office phone:  [phone]

## 2013-12-19 NOTE — H&P (View-Only) (Signed)
Kathy Sherman 12/03/2013 8:20 AM Location: [facility] Patient #: [id number] DOB: 06-17-1955 Undefined / Language: Undefined / Race: Undefined Female  History of Present Illness   The patient is a 58 year old female who presents with breast cancer. Her PCP is Dr. Leonarda Sherman, [place]. She lives in [place].  She is in the [facility]. Dr. Lindi Sherman and Kathy Sherman are her treating oncologist.  She is accompanied with Kathy [place] and Kathy Sherman (both female friends). Her husband is in the [facility] ICU after a biopsy of his renal transplant.  Her back ground is Kathy Sherman. I asked her how she got here - meaning how they found her breast cancer. She interpreted it as how she got from the [place] to the [place].  She gets annual mammogram, last one was about Sept 2014. She had a mammogram on 10/24/2013 at [place] that showed a left breast microcalcifications. On 11/14/2013 she had a mammogram that showed 5 x 8 x 20 mm loose group of heterogeneous calcifications. A left breast biopsy on 11/24/2013 [phone]) showed DCIS, ER - 0, PR - 0.  She has no family history of breast cancer. She is not on hormone meds. Her last period was about 6 years ago.  Her MRI on 12/01/2013 showed a 2.2 cm area of enhancement in the upper central portion of the left breast. She had an incidental right renal cyst (2.0 cm).  I discussed the options for breast cancer treatment with the patient. She is in the [facility] [place] and is seeing medical oncology and radiation oncology. I discussed the surgical options of lumpectomy vs. mastectomy. If mastectomy, there is the possibility of reconstruction. I discussed the options of lymph node biopsy. The treatment plan depends on the pathologic staging of the tumor and the patient's personal wishes. The risks of surgery include, but are not limited to, bleeding, infection, the need for further surgery, and nerve injury. The patient has been  given literature on the treatment of breast cancer.  Plan: 1) Left breast lumpectomy (seed localization), 2) radiation therapy  Past medical history: She fractured her left wrist in April 2015. Required surgery by Dr. Celesta Sherman in [place].  Social History: Married. Husband in ICU at [facility]. He has had a renal transplant. She has one son, age 16, at [employer]. 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  Other Problems Kathy Sherman; 12/03/2013 8:21 AM) Breast Cancer General anesthesia - complications Hypercholesterolemia Lump In Breast  Past Surgical History Kathy Sherman; 12/03/2013 8:21 AM) Breast Biopsy Left. Oral Surgery  Diagnostic Studies History Kathy Sherman; 12/03/2013 8:21 AM) Colonoscopy never Mammogram within last year Pap Smear 1-5 years ago  Social History Kathy Sherman; 12/03/2013 8:21 AM) Caffeine use Carbonated beverages, Coffee. No alcohol use No drug use Tobacco use Never smoker.  Family History Kathy Sherman; 12/03/2013 8:21 AM) Bleeding disorder Father. Hypertension Brother.  Pregnancy / Birth History Kathy Sherman; 12/03/2013 8:21 AM) Age at menarche 74 years. Age of menopause 52-55 Gravida 1 Irregular periods Maternal age 52-35 Para 1  Review of Systems Kathy Sherman; 12/03/2013 8:21 AM) General Present- Weight Loss. Not Present- Appetite Loss, Chills, Fatigue, Fever, Night Sweats and Weight Gain. Skin Not Present- Change in Wart/Mole, Dryness, Hives, Jaundice, New Lesions, Non-Healing Wounds, Rash and Ulcer. HEENT Present- Seasonal Allergies and Wears glasses/contact lenses. Not Present- Earache, Hearing Loss, Hoarseness, Nose Bleed, Oral Ulcers, Ringing in the Ears, Sinus Pain, Sore Throat, Visual Disturbances and Yellow Eyes. Respiratory Present- Snoring.  Not Present- Bloody sputum, Chronic Cough, Difficulty Breathing and Wheezing. Breast Not Present- Breast Mass, Breast Pain, Nipple  Discharge and Skin Changes. Cardiovascular Not Present- Chest Pain, Difficulty Breathing Lying Down, Leg Cramps, Palpitations, Rapid Heart Rate, Shortness of Breath and Swelling of Extremities. Gastrointestinal Not Present- Abdominal Pain, Bloating, Bloody Stool, Change in Bowel Habits, Chronic diarrhea, Constipation, Difficulty Swallowing, Excessive gas, Gets full quickly at meals, Hemorrhoids, Indigestion, Nausea, Rectal Pain and Vomiting. Female Genitourinary Not Present- Frequency, Nocturia, Painful Urination, Pelvic Pain and Urgency. Musculoskeletal Not Present- Back Pain, Joint Pain, Joint Stiffness, Muscle Pain, Muscle Weakness and Swelling of Extremities. Neurological Not Present- Decreased Memory, Fainting, Headaches, Numbness, Seizures, Tingling, Tremor, Trouble walking and Weakness. Psychiatric Not Present- Anxiety, Bipolar, Change in Sleep Pattern, Depression, Fearful and Frequent crying. Endocrine Not Present- Cold Intolerance, Excessive Hunger, Hair Changes, Heat Intolerance, Hot flashes and New Diabetes. Hematology Not Present- Easy Bruising, Excessive bleeding, Gland problems, HIV and Persistent Infections.  Physical Exam: Note:General: WN Kathy Sherman F alert and generally healthy appearing. HEENT: Normal. Pupils equal. Good dentition.  Neck: Supple. No mass. No thyroid mass.  Lymph Nodes: No supraclavicular, cervical, or axillary nodes.  Lungs: Clear to auscultation and symmetric breath sounds. Heart: RRR. No murmur or rub.  Breasts: Right - No mass or nipple discharge Left - Bruise at 12 o'clock. Minimal mass effect, probably from buise. No nipple discharge.  Abdomen: Soft. No mass. No tenderness. No hernia. Normal bowel sounds. No abdominal scars. Rectal: Not done.  Extremities: Good strength and ROM in upper and lower extremities. Scar left wrist from surgery.  Neurologic: Grossly intact to motor and sensory function. Psychiatric: Has normal mood and  affect. Behavior is normal.   Assessment & Plan: DCIS (DUCTAL CARCINOMA IN SITU) OF BREAST, LEFT (233.0  D05.12) Story: Microca++ at 12 o'clock  Left breast biopsy on 11/24/2013 ([id number]) showed DCIS, ER - 0, PR - 0. Impression: Plan: 1) Seed localization left breast lumpectomy, 2) Radiation tx  Current Plans:  Schedule for Surgery  Alphonsa Overall, MD, [facility] Pager: [phone] Office phone:  [phone]

## 2013-12-19 NOTE — Anesthesia Postprocedure Evaluation (Signed)
Anesthesia Post Note  Patient: Kathy Sherman  Procedure(s) Performed: Procedure(s) (LRB): LEFT BREAST LUMPECTOMY WITH RADIOACTIVE SEED LOCALIZATION (Left)  Anesthesia type: general  Patient location: PACU  Post pain: Pain level controlled  Post assessment: Patient's Cardiovascular Status Stable  Last Vitals:  Filed Vitals:   12/19/13 1709  BP: 151/71  Pulse: 85  Temp: 36.9 C  Resp: 18    Post vital signs: Reviewed and stable  Level of consciousness: sedated  Complications: No apparent anesthesia complications

## 2013-12-22 ENCOUNTER — Encounter (HOSPITAL_BASED_OUTPATIENT_CLINIC_OR_DEPARTMENT_OTHER): Payer: Self-pay | Admitting: Surgery

## 2013-12-29 ENCOUNTER — Telehealth: Payer: Self-pay | Admitting: Hematology and Oncology

## 2013-12-29 ENCOUNTER — Ambulatory Visit (HOSPITAL_BASED_OUTPATIENT_CLINIC_OR_DEPARTMENT_OTHER): Payer: 59 | Admitting: Hematology and Oncology

## 2013-12-29 VITALS — BP 120/72 | HR 75 | Temp 98.9°F | Resp 18 | Ht 59.0 in | Wt 118.9 lb

## 2013-12-29 DIAGNOSIS — C50112 Malignant neoplasm of central portion of left female breast: Secondary | ICD-10-CM

## 2013-12-29 DIAGNOSIS — D0512 Intraductal carcinoma in situ of left breast: Secondary | ICD-10-CM

## 2013-12-29 NOTE — Telephone Encounter (Signed)
, °

## 2013-12-29 NOTE — Progress Notes (Signed)
Patient Care Team: [name], MD as PCP - General (Family Medicine) Rulon Eisenmenger, MD as Consulting Physician (Hematology and Oncology) Alphonsa Overall, MD as Consulting Physician (General Surgery) Rexene Edison, MD as Consulting Physician (Radiation Oncology) Trinda Pascal, NP as Nurse Practitioner (Nurse Practitioner)  DIAGNOSIS: Cancer of central portion of left female breast   Staging form: Breast, AJCC 7th Edition     Clinical stage from 12/03/2013: Stage 0 (Tis (DCIS), N0, M0) - Unsigned       Staging comments: Staged at breast conference on 11.18.15      Pathologic stage from 12/19/2013: Stage Unknown (Tis (DCIS), NX, cM0) - Signed by Rulon Eisenmenger, MD on 12/25/2013       Staging comments: Staging from final excisional path by Dr. Avis Epley    SUMMARY OF ONCOLOGIC HISTORY:   Cancer of central portion of left female breast   11/24/2013 Initial Biopsy DCIS with calcifications and necrosis ER 0%, PR 0%   12/01/2013 Breast MRI Left breast upper central portion 2.2 cm enhancement; incidental note of right renal cyst 2 cm   12/19/2013 Surgery Left breast lumpectomy: High-grade DCIS with comedonecrosis 1.5 cm margins negative ER 0% PR 0%     CHIEF COMPLIANT: Follow-up after surgery  INTERVAL HISTORY: Kathy Sherman is a 58 year old [place] lady with above-mentioned history of DCIS involving the left breast treated with lumpectomy on 12/19/2013. She is here postoperatively to discuss the final pathology report. She is recovering very well from the surgery with very minimal pain or symptoms. Her husband is still in the intensive care unit and he now has a chest tube and is quite ill.  REVIEW OF SYSTEMS:   Constitutional: Denies fevers, chills or abnormal weight loss Eyes: Denies blurriness of vision Ears, nose, mouth, throat, and face: Denies mucositis or sore throat Respiratory: Denies cough, dyspnea or wheezes Cardiovascular: Denies palpitation, chest discomfort or lower  extremity swelling Gastrointestinal:  Denies nausea, heartburn or change in bowel habits Skin: Denies abnormal skin rashes Lymphatics: Denies new lymphadenopathy or easy bruising Neurological:Denies numbness, tingling or new weaknesses Behavioral/Psych: Mood is stable, no new changes  Breast: Healing very well from surgery All other systems were reviewed with the patient and are negative.  I have reviewed the past medical history, past surgical history, social history and family history with the patient and they are unchanged from previous note.  ALLERGIES:  has No Known Allergies.  MEDICATIONS:  Current Outpatient Prescriptions  Medication Sig Dispense Refill  . ferrous sulfate 325 (65 FE) MG tablet Take 325 mg by mouth daily with breakfast.    . HYDROcodone-acetaminophen (NORCO/VICODIN) 5-325 MG per tablet Take 1-2 tablets by mouth every 6 (six) hours as needed. 30 tablet 0   No current facility-administered medications for this visit.    PHYSICAL EXAMINATION: ECOG PERFORMANCE STATUS: 1 - Symptomatic but completely ambulatory  Filed Vitals:   12/29/13 1441  BP: 120/72  Pulse: 75  Temp: 98.9 F (37.2 C)  Resp: 18   Filed Weights   12/29/13 1441  Weight: 118 lb 14.4 oz (53.933 kg)    GENERAL:alert, no distress and comfortable SKIN: skin color, texture, turgor are normal, no rashes or significant lesions EYES: normal, Conjunctiva are pink and non-injected, sclera clear OROPHARYNX:no exudate, no erythema and lips, buccal mucosa, and tongue normal  NECK: supple, thyroid normal size, non-tender, without nodularity LYMPH:  no palpable lymphadenopathy in the cervical, axillary or inguinal LUNGS: clear to auscultation and percussion with normal breathing effort  HEART: regular rate & rhythm and no murmurs and no lower extremity edema ABDOMEN:abdomen soft, non-tender and normal bowel sounds Musculoskeletal:no cyanosis of digits and no clubbing  NEURO: alert & oriented x 3 with  fluent speech, no focal motor/sensory deficits  LABORATORY DATA:  I have reviewed the data as listed   Chemistry      Component Value Date/Time   NA 142 12/03/2013 0833   K 3.8 12/03/2013 0833   CO2 27 12/03/2013 0833   BUN 15.3 12/03/2013 0833   CREATININE 0.8 12/03/2013 0833      Component Value Date/Time   CALCIUM 9.6 12/03/2013 0833   ALKPHOS 107 12/03/2013 0833   AST 16 12/03/2013 0833   ALT 13 12/03/2013 0833   BILITOT 0.47 12/03/2013 0833       Lab Results  Component Value Date   WBC 4.2 12/03/2013   HGB 14.0 12/03/2013   HCT 43.3 12/03/2013   MCV 84.0 12/03/2013   PLT 247 12/03/2013   NEUTROABS 2.1 12/03/2013   ASSESSMENT & PLAN:  Cancer of central portion of left female breast Left breast DCIS with Coumadin necrosis ER 0% PR 0% status post left lumpectomy on 12/19/2013  I discussed the final pathology report as well as significance of being high grade and having Coumadin necrosis. I discussed with her the risk of recurrence is 1% per year. Assuming a life expectancy of 80 years she would have 25% risk of DCIS or invasive cancer. Recommended adjuvant therapy with anti-estrogen pills with Arimidex. I discussed the risks and benefits. The benefit would be that would prevent the risk of estrogen-positive DCIS or breast cancer. She understands that it would not prevent the risk of estrogen negative DCIS or breast cancer. After hearing the risks and benefits, patient elected to not pursue additional adjuvant treatment measures other than radiation therapy. She has an appointment with radiation oncology on 12/31/2013.   Surveillance plan: Every 6 month breast exams, annual mammograms.  Survivorship: I encouraged her to exercise at least 30 minutes daily and to increase fruits and vegetable intake.     No orders of the defined types were placed in this encounter.   The patient has a good understanding of the overall plan. she agrees with it. She will call with any  problems that may develop before her next visit here.   Rulon Eisenmenger, MD 12/29/2013 3:14 PM

## 2013-12-29 NOTE — Assessment & Plan Note (Signed)
Left breast DCIS with Coumadin necrosis ER 0% PR 0% status post left lumpectomy on 12/19/2013  I discussed the final pathology report as well as significance of being high grade and having Coumadin necrosis. I discussed with her the risk of recurrence is 1% per year. Assuming a life expectancy of 80 years she would have 25% risk of DCIS or invasive cancer. Recommended adjuvant therapy with anti-estrogen pills with Arimidex. I discussed the risks and benefits. The benefit would be that would prevent the risk of estrogen-positive DCIS or breast cancer. She understands that it would not prevent the risk of estrogen negative DCIS or breast cancer. After hearing the risks and benefits, patient elected to not pursue additional adjuvant treatment measures other than radiation therapy. She has an appointment with radiation oncology on 12/31/2013.   Surveillance plan: Every 6 month breast exams, annual mammograms.  Survivorship: I encouraged her to exercise at least 30 minutes daily and to increase fruits and vegetable intake.

## 2013-12-30 ENCOUNTER — Encounter: Payer: Self-pay | Admitting: Radiation Oncology

## 2013-12-30 NOTE — Progress Notes (Signed)
Location of Breast Cancer: left, 12 o'clock  Histology per Pathology Report:  12/19/13 Diagnosis 1. Breast, lumpectomy, left - HIGH GRADE DUCTAL CARCINOMA WITH COMEDONECROSIS, SPANNING 1.5 CM IN GREATEST DIMENSION. - MARGINS ARE NEGATIVE. - SEE ONCOLOGY TEMPLATE. 2. Breast, excision, left superior margin - BENIGN BREAST PARENCHYMA WITH FIBROCYTIC CHANGES. - NO ATYPIA, HYPERPLASIA, OR MALIGNANCY IDENTIFIED.  11/24/13 Diagnosis Breast, left, needle core biopsy, 12 o'clock - DUCTAL CARCINOMA IN SITU WITH CALCIFICATIONS AND NECROSIS, SEE COMMENT.  Receptor Status: ER(-), PR (-), Her2-neu ()  Did patient present with symptoms (if so, please note symptoms) or was this found on screening mammography?: yearly mammogram  Past/Anticipated interventions by surgeon, if any: Dr Lucia Gaskins: left lumpectomy  Past/Anticipated interventions by medical oncology, if any: Chemotherapy  Dr Lindi Adie: Recommended adjuvant therapy with anti-estrogen pills with Arimidex. I discussed the risks and benefits. The benefit would be that would prevent the risk of estrogen-positive DCIS or breast cancer. She understands that it would not prevent the risk of estrogen negative DCIS or breast cancer. After hearing the risks and benefits, patient elected to not pursue additional adjuvant treatment measures other than radiation therapy. She has an appointment with radiation oncology on 12/31/2013.  Surveillance plan: Every 6 month breast exams, annual mammograms.   Lymphedema issues, if any:    no  Pain issues, if any:   no  SAFETY ISSUES:  Prior radiation? no  Pacemaker/ICD? no  Possible current pregnancy? no  Is the patient on methotrexate? 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

## 2013-12-31 ENCOUNTER — Ambulatory Visit
Admission: RE | Admit: 2013-12-31 | Discharge: 2013-12-31 | Disposition: A | Discharge status: Home / Self Care | Payer: 59 | Source: Ambulatory Visit | Attending: Radiation Oncology | Admitting: Radiation Oncology

## 2013-12-31 ENCOUNTER — Telehealth: Payer: Self-pay | Admitting: *Deleted

## 2013-12-31 ENCOUNTER — Encounter: Payer: Self-pay | Admitting: Radiation Oncology

## 2013-12-31 VITALS — BP 115/89 | HR 70 | Temp 98.1°F | Resp 20 | Ht 59.0 in | Wt 118.8 lb

## 2013-12-31 DIAGNOSIS — L599 Disorder of the skin and subcutaneous tissue related to radiation, unspecified: Secondary | ICD-10-CM | POA: Diagnosis not present

## 2013-12-31 DIAGNOSIS — Z510 Encounter for antineoplastic radiation therapy: Secondary | ICD-10-CM | POA: Diagnosis not present

## 2013-12-31 DIAGNOSIS — C50112 Malignant neoplasm of central portion of left female breast: Secondary | ICD-10-CM

## 2013-12-31 DIAGNOSIS — Z171 Estrogen receptor negative status [ER-]: Secondary | ICD-10-CM | POA: Insufficient documentation

## 2013-12-31 NOTE — Progress Notes (Signed)
Please see the Nurse Progress Note in the MD Initial Consult Encounter for this patient. 

## 2013-12-31 NOTE — Telephone Encounter (Signed)
CALLED PATIENT TO INFORM OF APPT. FOR MAMMOGRAM ON 01-19-14- ARRIVAL TIME - 2:30 PM @ [facility], SPOKE WITH PATIENT AND SHE IS AWARE OF THIS TEST.

## 2013-12-31 NOTE — Progress Notes (Signed)
CC: Dr. Alphonsa Overall  Follow-up note:  Diagnosis: Stage 0 (Tis N0 M0) high-grade DCIS of the left breast  Kathy Sherman is a pleasant 59 year old female who is seen today for review and scheduling of her left breast radiation therapy in the management of her high-grade DCIS of the left breast. At the time of a screening tomo mammogram at the [facility] on October 9 she was seen to have new left breast calcifications. Follow-up mammography on October 30 showed a 5 x 8 x 20 mm area of calcifications within the upper retroareolar region. Biopsy on November 9 was diagnostic for high-grade DCIS with calcifications and necrosis. Her DCIS was ER and PR negative. Breast MR on November 16 showed non-mass like enhancement over an area of 1.3 x 2.2 x 1.7 cm within the upper central breast.  She underwent a left partial mastectomy on 12/19/2013.  She was found to have a 1.5 cm area of high-grade DCIS with comedonecrosis.  The closest margin was 0.55 cm, inferiorly.  The DCIS was ER/PR negative.  She is without complaints today.  Physical examination: Alert and oriented. Filed Vitals:   12/31/13 1253  BP: 115/89  Pulse: 70  Temp: 98.1 F (36.7 C)  Resp: 20   Head and neck examination: Grossly unremarkable.  Nodes: Without palpable cervical, supraclavicular, or axillary lymphadenopathy.  Chest: Lungs clear.  Breasts: There is a partial mastectomy wound along the upper periareolar margin of the left breast extending from 9:00 to 3:00.  The wound is healing well.  There is a small bruise along the lower inner quadrant of the left breast.  No masses are appreciated.  Right breast without masses or lesions.  Extremities: Without edema.  Impression: Stage 0 (Tis N0 M0) high-grade DCIS of the left breast.  The patient has chosen breast conservation.  We discussed the potential acute and late toxicities of radiation therapy.  She may be a candidate for deep inspiration breath-hold technology to avoid cardiac  irradiation.  We discussed the controversy regarding types fractionation for high-grade disease and based on hypofractionation studies for invasive ductal carcinoma.  All things considered, I feel that she would be perfectly appropriate for hypofractionated radiation therapy over 3-1/2 weeks.  I would like to obtain a baseline left breast mammogram to confirm removal of all suspicious microcalcifications which is expected to be the case.  Consent is signed today.  She will return in early January for her mammogram and CT simulation.  30 minutes was spent face-to-face with the patient, primarily counseling the patient and coordinating her care.

## 2013-12-31 NOTE — Telephone Encounter (Signed)
Called patient to inform of mammogram on 01-19-14 being rescheduled for to arrive at 8 am this morning, spoke with patient and she is aware of this appt. change.

## 2013-12-31 NOTE — Addendum Note (Signed)
Encounter addended by: Andria Rhein, RN on: 12/31/2013  2:51 PM<BR>     Documentation filed: Charges VN

## 2014-01-02 ENCOUNTER — Telehealth: Payer: Self-pay

## 2014-01-02 NOTE — Telephone Encounter (Signed)
Office notes rcvd from [facility] dtd 01/01/14.  Reviewed by Dr Lindi Adie.  Sent to scan.

## 2014-01-19 ENCOUNTER — Ambulatory Visit
Admission: RE | Admit: 2014-01-19 | Discharge: 2014-01-19 | Disposition: A | Discharge status: Home / Self Care | Payer: 59 | Source: Ambulatory Visit | Attending: Radiation Oncology | Admitting: Radiation Oncology

## 2014-01-19 DIAGNOSIS — C50112 Malignant neoplasm of central portion of left female breast: Secondary | ICD-10-CM

## 2014-01-26 ENCOUNTER — Ambulatory Visit: Admission: RE | Admit: 2014-01-26 | Payer: 59 | Source: Ambulatory Visit | Admitting: Radiation Oncology

## 2014-01-27 ENCOUNTER — Encounter: Payer: Self-pay | Admitting: Radiation Oncology

## 2014-01-27 ENCOUNTER — Ambulatory Visit
Admission: RE | Admit: 2014-01-27 | Discharge: 2014-01-27 | Disposition: A | Discharge status: Home / Self Care | Payer: 59 | Source: Ambulatory Visit | Attending: Radiation Oncology | Admitting: Radiation Oncology

## 2014-01-27 DIAGNOSIS — C50112 Malignant neoplasm of central portion of left female breast: Secondary | ICD-10-CM

## 2014-01-27 DIAGNOSIS — Z510 Encounter for antineoplastic radiation therapy: Secondary | ICD-10-CM | POA: Diagnosis not present

## 2014-01-27 NOTE — Progress Notes (Signed)
Attending physician's statement rec'd from patient - forward to RN for completion.

## 2014-01-27 NOTE — Progress Notes (Signed)
Complex simulation/treatment planning note: The patient was taken to the CT simulator.  A Vac lock immobilization device was constructed on a custom breast board.  Her left breast field borders were marked with radiopaque wires.  She was scanned free breathing.  Tangential fields would have included the heart and thus she was rescanned with deep inspiration breath-hold.  This move the heart away from the tangential fields.  The CT data set was sent to the planning system where I contoured her tumor bed.  Her other normal structures were contoured including her heart and lungs.  She was set up to medial and lateral left breast tangents.  2 unique sets of multileaf collimators were designed to conform the field.  She is now ready for 3-D simulation.  I'm prescribing 4250 cGy in 17 sessions utilizing 6 MV photons.

## 2014-01-28 ENCOUNTER — Encounter: Payer: Self-pay | Admitting: Radiation Oncology

## 2014-01-28 DIAGNOSIS — Z510 Encounter for antineoplastic radiation therapy: Secondary | ICD-10-CM | POA: Diagnosis not present

## 2014-01-28 NOTE — Progress Notes (Addendum)
3-D simulation note: The patient completed 3-D simulation for treatment to her left breast.  Dose volume histograms were obtained for the target structures/tumor bed in addition to avoidance structures including the lungs and heart.  She was set up to medial and lateral left breast tangents with deep inspiration breath-hold.  Four unique complex treatment devices including 2 sets of MLCs and 2 electronic compensators were employed to conform the field.  I prescribing 4250 cGy in 17 sessions utilizing 6 MV photons.  No boost.

## 2014-01-29 DIAGNOSIS — Z510 Encounter for antineoplastic radiation therapy: Secondary | ICD-10-CM | POA: Diagnosis not present

## 2014-02-03 ENCOUNTER — Encounter: Payer: Self-pay | Admitting: Radiation Oncology

## 2014-02-03 NOTE — Progress Notes (Signed)
1.19.16:  Rec'd Attending Physician's statement back from Dr. Valere Dross - made copy for scanning.  Original placed in envelope and given to staff on L3 to give to patient on 02/04/14.

## 2014-02-04 ENCOUNTER — Ambulatory Visit
Admission: RE | Admit: 2014-02-04 | Discharge: 2014-02-04 | Disposition: A | Discharge status: Home / Self Care | Payer: 59 | Source: Ambulatory Visit | Attending: Radiation Oncology | Admitting: Radiation Oncology

## 2014-02-04 DIAGNOSIS — C50112 Malignant neoplasm of central portion of left female breast: Secondary | ICD-10-CM

## 2014-02-04 DIAGNOSIS — Z510 Encounter for antineoplastic radiation therapy: Secondary | ICD-10-CM | POA: Diagnosis not present

## 2014-02-04 NOTE — Progress Notes (Signed)
Simulation verification note: The patient underwent simulation verification today for treatment to her left breast.  Her isocenter is in good position and the multileaf collimators contoured the treatment volume appropriately.

## 2014-02-05 ENCOUNTER — Ambulatory Visit
Admission: RE | Admit: 2014-02-05 | Discharge: 2014-02-05 | Disposition: A | Discharge status: Home / Self Care | Payer: 59 | Source: Ambulatory Visit | Attending: Radiation Oncology | Admitting: Radiation Oncology

## 2014-02-05 DIAGNOSIS — Z510 Encounter for antineoplastic radiation therapy: Secondary | ICD-10-CM | POA: Diagnosis not present

## 2014-02-06 ENCOUNTER — Ambulatory Visit
Admission: RE | Admit: 2014-02-06 | Discharge: 2014-02-06 | Disposition: A | Discharge status: Home / Self Care | Payer: 59 | Source: Ambulatory Visit | Attending: Radiation Oncology | Admitting: Radiation Oncology

## 2014-02-06 DIAGNOSIS — Z510 Encounter for antineoplastic radiation therapy: Secondary | ICD-10-CM | POA: Diagnosis not present

## 2014-02-09 ENCOUNTER — Ambulatory Visit
Admission: RE | Admit: 2014-02-09 | Discharge: 2014-02-09 | Disposition: A | Discharge status: Home / Self Care | Payer: 59 | Source: Ambulatory Visit | Attending: Radiation Oncology | Admitting: Radiation Oncology

## 2014-02-09 ENCOUNTER — Ambulatory Visit: Admission: RE | Admit: 2014-02-09 | Payer: 59 | Source: Ambulatory Visit

## 2014-02-09 VITALS — BP 112/65 | HR 70 | Temp 98.2°F | Wt 116.2 lb

## 2014-02-09 DIAGNOSIS — Z510 Encounter for antineoplastic radiation therapy: Secondary | ICD-10-CM | POA: Diagnosis not present

## 2014-02-09 DIAGNOSIS — C50112 Malignant neoplasm of central portion of left female breast: Secondary | ICD-10-CM

## 2014-02-09 MED ORDER — RADIAPLEXRX EX GEL
Freq: Once | CUTANEOUS | Status: AC
  Administered 2014-02-09: 09:00:00 via TOPICAL

## 2014-02-09 MED ORDER — ALRA NON-METALLIC DEODORANT (RAD-ONC)
1.0000 "application " | Freq: Once | TOPICAL | Status: AC
  Administered 2014-02-09: 1 via TOPICAL

## 2014-02-09 NOTE — Progress Notes (Signed)
Weekly Management Note:  Site: Left breast  Current Dose:  500 cGy Projected Dose: 4250  cGy, no boost  Narrative: The patient is seen today for routine under treatment assessment. CBCT/MVCT images/port films were reviewed. The chart was reviewed.   She is without complaints today.  She was given Radioplex gel today.  Physical Examination:  Filed Vitals:   02/09/14 0858  BP: 112/65  Pulse: 70  Temp: 98.2 F (36.8 C)  .  Weight: 116 lb 3.2 oz (52.708 kg).  No skin changes.  Impression: Tolerating radiation therapy well.  Plan: Continue radiation therapy as planned.

## 2014-02-09 NOTE — Progress Notes (Signed)
Weekly assessment of radiation to left breast.Completed 2 of 17 treatments.Given Radiation Therapy and You Booklet, skin care sheet, alra deodorant and radiaplex.Reviewed information in general , she will read book and then teaching will be reinforced in next day or 2 as linac 3 is down today until further notice.

## 2014-02-10 ENCOUNTER — Ambulatory Visit
Admission: RE | Admit: 2014-02-10 | Discharge: 2014-02-10 | Disposition: A | Discharge status: Home / Self Care | Payer: 59 | Source: Ambulatory Visit | Attending: Radiation Oncology | Admitting: Radiation Oncology

## 2014-02-10 DIAGNOSIS — Z510 Encounter for antineoplastic radiation therapy: Secondary | ICD-10-CM | POA: Diagnosis not present

## 2014-02-11 ENCOUNTER — Ambulatory Visit
Admission: RE | Admit: 2014-02-11 | Discharge: 2014-02-11 | Disposition: A | Discharge status: Home / Self Care | Payer: 59 | Source: Ambulatory Visit | Attending: Radiation Oncology | Admitting: Radiation Oncology

## 2014-02-11 DIAGNOSIS — Z510 Encounter for antineoplastic radiation therapy: Secondary | ICD-10-CM | POA: Diagnosis not present

## 2014-02-12 ENCOUNTER — Ambulatory Visit
Admission: RE | Admit: 2014-02-12 | Discharge: 2014-02-12 | Disposition: A | Discharge status: Home / Self Care | Payer: 59 | Source: Ambulatory Visit | Attending: Radiation Oncology | Admitting: Radiation Oncology

## 2014-02-12 DIAGNOSIS — Z510 Encounter for antineoplastic radiation therapy: Secondary | ICD-10-CM | POA: Diagnosis not present

## 2014-02-13 ENCOUNTER — Ambulatory Visit
Admission: RE | Admit: 2014-02-13 | Discharge: 2014-02-13 | Disposition: A | Discharge status: Home / Self Care | Payer: 59 | Source: Ambulatory Visit | Attending: Radiation Oncology | Admitting: Radiation Oncology

## 2014-02-13 DIAGNOSIS — Z510 Encounter for antineoplastic radiation therapy: Secondary | ICD-10-CM | POA: Diagnosis not present

## 2014-02-16 ENCOUNTER — Ambulatory Visit
Admission: RE | Admit: 2014-02-16 | Discharge: 2014-02-16 | Disposition: A | Discharge status: Home / Self Care | Payer: 59 | Source: Ambulatory Visit | Attending: Radiation Oncology | Admitting: Radiation Oncology

## 2014-02-16 ENCOUNTER — Encounter: Payer: Self-pay | Admitting: Radiation Oncology

## 2014-02-16 VITALS — BP 114/67 | HR 74 | Temp 97.9°F | Resp 12 | Wt 117.5 lb

## 2014-02-16 DIAGNOSIS — Z510 Encounter for antineoplastic radiation therapy: Secondary | ICD-10-CM | POA: Diagnosis not present

## 2014-02-16 DIAGNOSIS — Z923 Personal history of irradiation: Secondary | ICD-10-CM

## 2014-02-16 DIAGNOSIS — C50112 Malignant neoplasm of central portion of left female breast: Secondary | ICD-10-CM

## 2014-02-16 HISTORY — DX: Personal history of irradiation: Z92.3

## 2014-02-16 NOTE — Progress Notes (Signed)
She is currently in no pain.   Pt left breast- warm dry and intact.  Pt denies edema. Pt continues to apply Radiaplex as directed. BP 114/67 mmHg  Pulse 74  Temp(Src) 97.9 F (36.6 C) (Oral)  Resp 12  Wt 117 lb 8 oz (53.298 kg)  SpO2 100%

## 2014-02-16 NOTE — Progress Notes (Signed)
Weekly Management Note:  Site: Breast  Current Dose:  1750  cGy Projected Dose: 4250  cGy  Narrative: The patient is seen today for routine under treatment assessment. CBCT/MVCT images/port films were reviewed. The chart was reviewed.   She is without complaints today.  She uses Radioplex gel when necessary.  Physical Examination:  Filed Vitals:   02/16/14 0910  BP: 114/67  Pulse: 74  Temp: 97.9 F (36.6 C)  Resp: 12  .  Weight: 117 lb 8 oz (53.298 kg).  There is faint hyperpigmentation the skin along the left breast with no areas of desquamation.  Impression: Tolerating radiation therapy well.  Plan: Continue radiation therapy as planned.

## 2014-02-17 ENCOUNTER — Ambulatory Visit
Admission: RE | Admit: 2014-02-17 | Discharge: 2014-02-17 | Disposition: A | Discharge status: Home / Self Care | Payer: 59 | Source: Ambulatory Visit | Attending: Radiation Oncology | Admitting: Radiation Oncology

## 2014-02-17 DIAGNOSIS — Z510 Encounter for antineoplastic radiation therapy: Secondary | ICD-10-CM | POA: Diagnosis not present

## 2014-02-18 ENCOUNTER — Ambulatory Visit
Admission: RE | Admit: 2014-02-18 | Discharge: 2014-02-18 | Disposition: A | Discharge status: Home / Self Care | Payer: 59 | Source: Ambulatory Visit | Attending: Radiation Oncology | Admitting: Radiation Oncology

## 2014-02-18 DIAGNOSIS — Z510 Encounter for antineoplastic radiation therapy: Secondary | ICD-10-CM | POA: Diagnosis not present

## 2014-02-19 ENCOUNTER — Ambulatory Visit
Admission: RE | Admit: 2014-02-19 | Discharge: 2014-02-19 | Disposition: A | Discharge status: Home / Self Care | Payer: 59 | Source: Ambulatory Visit | Attending: Radiation Oncology | Admitting: Radiation Oncology

## 2014-02-19 DIAGNOSIS — Z510 Encounter for antineoplastic radiation therapy: Secondary | ICD-10-CM | POA: Diagnosis not present

## 2014-02-20 ENCOUNTER — Ambulatory Visit
Admission: RE | Admit: 2014-02-20 | Discharge: 2014-02-20 | Disposition: A | Discharge status: Home / Self Care | Payer: 59 | Source: Ambulatory Visit | Attending: Radiation Oncology | Admitting: Radiation Oncology

## 2014-02-20 DIAGNOSIS — Z510 Encounter for antineoplastic radiation therapy: Secondary | ICD-10-CM | POA: Diagnosis not present

## 2014-02-23 ENCOUNTER — Encounter: Payer: Self-pay | Admitting: Radiation Oncology

## 2014-02-23 ENCOUNTER — Ambulatory Visit
Admission: RE | Admit: 2014-02-23 | Discharge: 2014-02-23 | Disposition: A | Discharge status: Home / Self Care | Payer: 59 | Source: Ambulatory Visit | Attending: Radiation Oncology | Admitting: Radiation Oncology

## 2014-02-23 VITALS — BP 104/52 | HR 69 | Temp 97.7°F | Resp 12 | Wt 116.3 lb

## 2014-02-23 DIAGNOSIS — Z510 Encounter for antineoplastic radiation therapy: Secondary | ICD-10-CM | POA: Diagnosis not present

## 2014-02-23 DIAGNOSIS — C50112 Malignant neoplasm of central portion of left female breast: Secondary | ICD-10-CM

## 2014-02-23 NOTE — Progress Notes (Signed)
Weekly Management Note:  Site: Left breast Current Dose:  3000  cGy Projected Dose: 4250  cGy  Narrative: The patient is seen today for routine under treatment assessment. CBCT/MVCT images/port films were reviewed. The chart was reviewed.   She is without complaints today.  She uses Radioplex gel.  Physical Examination:  Filed Vitals:   02/23/14 0910  BP: 104/52  Pulse: 69  Temp: 97.7 F (36.5 C)  Resp: 12  .  Weight: 116 lb 4.8 oz (52.753 kg).  There is mild hyperpigmentation the skin with no areas of desquamation.  Impression: Tolerating radiation therapy well.  She will finish radiation therapy in one week.  Plan: Continue radiation therapy as planned.

## 2014-02-23 NOTE — Progress Notes (Signed)
She is currently in no pain.  Pt left breast- positive for Dryness.  Pt denies edema. Pt continues to apply Radiaplex as directed. BP 104/52 mmHg  Pulse 69  Temp(Src) 97.7 F (36.5 C) (Oral)  Resp 12  Wt 116 lb 4.8 oz (52.753 kg)  SpO2 100%

## 2014-02-24 ENCOUNTER — Ambulatory Visit
Admission: RE | Admit: 2014-02-24 | Discharge: 2014-02-24 | Disposition: A | Discharge status: Home / Self Care | Payer: 59 | Source: Ambulatory Visit | Attending: Radiation Oncology | Admitting: Radiation Oncology

## 2014-02-24 DIAGNOSIS — Z510 Encounter for antineoplastic radiation therapy: Secondary | ICD-10-CM | POA: Diagnosis not present

## 2014-02-25 ENCOUNTER — Ambulatory Visit
Admission: RE | Admit: 2014-02-25 | Discharge: 2014-02-25 | Disposition: A | Discharge status: Home / Self Care | Payer: 59 | Source: Ambulatory Visit | Attending: Radiation Oncology | Admitting: Radiation Oncology

## 2014-02-25 DIAGNOSIS — Z510 Encounter for antineoplastic radiation therapy: Secondary | ICD-10-CM | POA: Diagnosis not present

## 2014-02-26 ENCOUNTER — Ambulatory Visit
Admission: RE | Admit: 2014-02-26 | Discharge: 2014-02-26 | Disposition: A | Discharge status: Home / Self Care | Payer: 59 | Source: Ambulatory Visit | Attending: Radiation Oncology | Admitting: Radiation Oncology

## 2014-02-26 DIAGNOSIS — Z510 Encounter for antineoplastic radiation therapy: Secondary | ICD-10-CM | POA: Diagnosis not present

## 2014-02-27 ENCOUNTER — Ambulatory Visit: Payer: 59

## 2014-02-27 ENCOUNTER — Ambulatory Visit
Admission: RE | Admit: 2014-02-27 | Discharge: 2014-02-27 | Disposition: A | Discharge status: Home / Self Care | Payer: 59 | Source: Ambulatory Visit | Attending: Radiation Oncology | Admitting: Radiation Oncology

## 2014-02-27 DIAGNOSIS — Z510 Encounter for antineoplastic radiation therapy: Secondary | ICD-10-CM | POA: Diagnosis not present

## 2014-03-02 ENCOUNTER — Ambulatory Visit
Admission: RE | Admit: 2014-03-02 | Discharge: 2014-03-02 | Disposition: A | Discharge status: Home / Self Care | Payer: 59 | Source: Ambulatory Visit | Attending: Radiation Oncology | Admitting: Radiation Oncology

## 2014-03-02 ENCOUNTER — Ambulatory Visit: Payer: 59

## 2014-03-02 ENCOUNTER — Encounter: Payer: Self-pay | Admitting: Radiation Oncology

## 2014-03-02 VITALS — BP 123/71 | HR 75 | Temp 97.9°F | Resp 16 | Ht 59.0 in | Wt 117.7 lb

## 2014-03-02 DIAGNOSIS — Z510 Encounter for antineoplastic radiation therapy: Secondary | ICD-10-CM | POA: Diagnosis not present

## 2014-03-02 DIAGNOSIS — L598 Other specified disorders of the skin and subcutaneous tissue related to radiation: Secondary | ICD-10-CM | POA: Diagnosis not present

## 2014-03-02 DIAGNOSIS — C50112 Malignant neoplasm of central portion of left female breast: Secondary | ICD-10-CM | POA: Insufficient documentation

## 2014-03-02 MED ORDER — RADIAPLEXRX EX GEL
Freq: Once | CUTANEOUS | Status: AC
  Administered 2014-03-02: 14:00:00 via TOPICAL

## 2014-03-02 NOTE — Progress Notes (Signed)
Weekly Management Note:  Site: Left breast Current Dose:  4250  cGy Projected Dose: 4250  cGy  Narrative: The patient is seen today for routine under treatment assessment. CBCT/MVCT images/port films were reviewed. The chart was reviewed.   She is without new complaints today.  She does have mild pruritus along the right breast.  She uses Radioplex gel.  Physical Examination:  Filed Vitals:   03/02/14 0917  BP: 123/71  Pulse: 75  Temp: 97.9 F (36.6 C)  Resp: 16  .  Weight: 117 lb 11.2 oz (53.388 kg).  There is mild to moderate hyperpigmentation the skin with no areas of desquamation.  Impression: Radiation therapy completed.  Plan: Follow-up visit in one month.

## 2014-03-02 NOTE — Addendum Note (Signed)
Encounter addended by: Jenene Slicker, RN on: 03/02/2014  2:07 PM<BR>     Documentation filed: [employer], Inpatient MAR, Orders

## 2014-03-02 NOTE — Progress Notes (Signed)
  Radiation Oncology         ([phone] ________________________________  Name: Kathy Sherman MRN: [id number]  Date: 01/28/14  DOB: Aug 03, 1955  RESPIRATORY MOTION MANAGEMENT SIMULATION  NARRATIVE:  In order to account for effect of respiratory motion on target structures and other organs in the planning and delivery of radiotherapy, this patient underwent respiratory motion management simulation.  To accomplish this, when the patient was brought to the CT simulation planning suite, 4D respiratoy motion management CT images were obtained.  The CT images were loaded into the planning software.  Then, using a variety of tools including Cine, MIP, and standard views, the target volume and planning target volumes (PTV) were delineated.  Avoidance structures were contoured.  Treatment planning then occurred.  Dose volume histograms were generated and reviewed for each of the requested structure.  The resulting plan was carefully reviewed and approved on 01/28/2014.

## 2014-03-02 NOTE — Progress Notes (Signed)
Nanci Cahoon completed treatment to her right breast with 17 fractions.  She denies pain and fatigue.  She reports that her right breast is itching.  The skin on her left breast has hyperpigmentation and is slightly red.  She is using radiaplex and has been given a refill.  BP 123/71 mmHg  Pulse 75  Temp(Src) 97.9 F (36.6 C) (Oral)  Resp 16  Ht 4\' 11"  (1.499 m)  Wt 117 lb 11.2 oz (53.388 kg)  BMI 23.76 kg/m2

## 2014-03-02 NOTE — Progress Notes (Signed)
[facility] End of Treatment Note  Name:Kathy Sherman  Date: 03/02/2014 NBZ:[id number] DOB:12-Aug-1955   Status:outpatient    CC: [name], MD  Dr. Alphonsa Overall  REFERRING PHYSICIAN: Dr. Alphonsa Overall    DIAGNOSIS: Stage 0 (Tis N0 M0) high-grade DCIS of the left breast    INDICATION FOR TREATMENT: Curative   TREATMENT DATES:  02/05/2014 through 03/02/2014                           SITE/DOSE:   Left breast 4250 cGy 17 sessions with deep inspiration breath-hold to avoid cardiac irradiation                         BEAMS/ENERGY: Tangential fields with 6 MV photons                  NARRATIVE:  Kathy Sherman tolerated her treatments well with no significant  radiation dermatitis by completion of therapy.  She used Radioplex gel.                          PLAN: Routine followup in one month. Patient instructed to call if questions or worsening complaints in interim.

## 2014-03-26 ENCOUNTER — Telehealth: Payer: Self-pay | Admitting: Hematology and Oncology

## 2014-03-26 NOTE — Telephone Encounter (Signed)
Lvm advising appt chg from 6/14 to 6/21 @ 9.15am due to md pal.

## 2014-03-30 ENCOUNTER — Encounter: Payer: Self-pay | Admitting: Radiation Oncology

## 2014-04-01 ENCOUNTER — Encounter: Payer: Self-pay | Admitting: Radiation Oncology

## 2014-04-01 ENCOUNTER — Encounter: Payer: Self-pay | Admitting: Adult Health

## 2014-04-01 ENCOUNTER — Ambulatory Visit
Admission: RE | Admit: 2014-04-01 | Discharge: 2014-04-01 | Disposition: A | Discharge status: Home / Self Care | Payer: 59 | Source: Ambulatory Visit | Attending: Radiation Oncology | Admitting: Radiation Oncology

## 2014-04-01 ENCOUNTER — Ambulatory Visit (HOSPITAL_BASED_OUTPATIENT_CLINIC_OR_DEPARTMENT_OTHER): Payer: 59 | Admitting: Adult Health

## 2014-04-01 VITALS — BP 146/72 | HR 81 | Temp 98.0°F | Ht 59.0 in | Wt 114.4 lb

## 2014-04-01 VITALS — BP 146/72 | HR 81 | Temp 98.0°F | Resp 16

## 2014-04-01 DIAGNOSIS — C50112 Malignant neoplasm of central portion of left female breast: Secondary | ICD-10-CM

## 2014-04-01 DIAGNOSIS — Z853 Personal history of malignant neoplasm of breast: Secondary | ICD-10-CM

## 2014-04-01 HISTORY — DX: Personal history of irradiation: Z92.3

## 2014-04-01 NOTE — Progress Notes (Signed)
CC: Dr. Alphonsa Overall, Dr. [name], Dr. Nicholas Lose  Follow-up note:  Kathy Sherman December returns today approximately 1 month following completion of radiation therapy following conservative surgery in the management of her DCIS of the left breast.  Her DCIS was hormone receptor negative.  She received hypofractionated radiation therapy.  She is without complaints today.  She tells me she will see Dr. Lindi Adie for a follow-up visit on June 21.  Physical examination: Alert and oriented. Filed Vitals:   04/01/14 0921  BP: 146/72  Pulse: 81  Temp: 98 F (36.7 C)   Nodes: Without palpable cervical, supraclavicular, or axillary lymphadenopathy.  Breasts: There is residual hyperpigmentation the skin along the left breast with minimal thickening.  No masses are appreciated.  Right breast without masses or lesions.  Extremities: Without edema.  Impression: Satisfactory progress.  She will see Dr. Lindi Adie for a follow-up visit on June 21.  She can resume mammography in October.  Plan: As above.  Provided that she sees Dr. Lucia Gaskins or Dr. Lindi Adie for long-term follow-up, she does not need to return to see me for a formal follow-up visit.  If not, and I'm happy to follow her.

## 2014-04-01 NOTE — Progress Notes (Signed)
CLINIC:  Cancer Survivorship   REASON FOR VISIT:  Routine follow-up post-treatment for a recent history of breast cancer.  BRIEF ONCOLOGIC HISTORY:    Cancer of central portion of left female breast   11/24/2013 Initial Biopsy DCIS with calcifications and necrosis ER 0%, PR 0%   12/01/2013 Breast MRI Left breast upper central portion 2.2 cm enhancement; incidental note of right renal cyst 2 cm   12/19/2013 Surgery Left breast lumpectomy: High-grade DCIS with comedonecrosis 1.5 cm margins negative ER 0% PR 0%    02/05/2014 - 03/02/2014 Radiation Therapy Left breast radiation therapy completed. 17 fractions, total dose: 4250 cGy.    INTERVAL HISTORY:  Kathy Sherman presents to the Survivorship Clinic today for our initial meeting to review her survivorship care plan detailing her treatment course for breast cancer, as well as monitoring long-term side effects of that treatment, education regarding health maintenance, screening, and overall wellness and health promotion.    Overall, Kathy Sherman reports feeling quite well since completing her radiation therapy about one month ago.  She is without any complaints today and is exercising (walking) several times per week.   She has a great appetite and is very happy to be finished with her anti-cancer treatments.   REVIEW OF SYSTEMS:  General: Denies fever, chills, unintentional weight loss, or generalized fatigue.  Cardiac: Denies palpitations, chest pain, and lower extremity edema.  Respiratory: Denies cough, shortness of breath, and dyspnea on exertion.  GI: Denies abdominal pain, constipation, diarrhea, nausea, or vomiting.  GU: Denies dysuria, hematuria, vaginal bleeding, vaginal discharge, or vaginal dryness.  Musculoskeletal: Denies joint or bone pain.  Neuro: Denies headache or recent falls. Denies peripheral neuropathy. Skin: Denies rash, pruritis, or open wounds.  Breast: Denies any new nodularity, masses, tenderness, nipple changes, or  nipple discharge.  Psych: Denies depression, anxiety, insomnia, or memory loss.   A 14-point review of systems was completed and was negative, except as noted above.  BRIEF ONCOLOGIC HISTORY:    Cancer of central portion of left female breast   11/24/2013 Initial Biopsy DCIS with calcifications and necrosis ER 0%, PR 0%   12/01/2013 Breast MRI Left breast upper central portion 2.2 cm enhancement; incidental note of right renal cyst 2 cm   12/19/2013 Surgery Left breast lumpectomy: High-grade DCIS with comedonecrosis 1.5 cm margins negative ER 0% PR 0%    02/05/2014 - 03/02/2014 Radiation Therapy Left breast radiation therapy completed. 17 fractions, total dose: 4250 cGy.    ONCOLOGY TREATMENT TEAM:  1. Surgeon:  Dr. Lucia Gaskins at [facility] 2. Medical Oncologist: Dr. Lindi Adie 3. Radiation Oncologist: Dr. Valere Dross    PAST MEDICAL/SURGICAL HISTORY:  Past Medical History  Diagnosis Date  . Breast cancer   . Wears glasses   . Wears partial dentures     top  . PONV (postoperative nausea and vomiting)   . S/P radiation therapy 02/05/14-03/02/14    left breast/4250Gy/17 sessions   Past Surgical History  Procedure Laterality Date  . Orif wrist fracture  4/15    left  . Multiple tooth extractions      top  . Breast lumpectomy with radioactive seed localization Left 12/19/2013    Procedure: LEFT BREAST LUMPECTOMY WITH RADIOACTIVE SEED LOCALIZATION;  Surgeon: Alphonsa Overall, MD;  Location: [place];  Service: General;  Laterality: Left;     CURRENT MEDICATIONS:  Outpatient Encounter Prescriptions as of 04/01/2014  Medication Sig  . ferrous sulfate 325 (65 FE) MG tablet Take 325  mg by mouth daily with breakfast.     ONCOLOGIC FAMILY HISTORY:  None    SOCIAL HISTORY:  Kathy Sherman is married and lives with her husband in  [place].  She has (#) children and they live in [place], [place].  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  She and her husband have 1 son, who currently lives with them and is a Ship broker at [employer].  She denies any current or history of tobacco, alcohol, or illicit drug use.     PHYSICAL EXAMINATION:  Vital Signs:   Filed Vitals:   04/01/14 0922  BP: 146/72  Pulse: 81  Temp: 98 F (36.7 C)  Resp: 16   General: Well-nourished, well-appearing female in no acute distress.  She is unaccompanied in clinic today.   HEENT: Head is atraumatic and normocephalic.  Pupils equal and reactive to light and accomodation. Conjunctivae clear without exudate.  Sclerae anicteric. Oral mucosa is pink, moist, and intact without lesions.  Oropharynx is pink without lesions or erythema.  Lymph: No cervical, supraclavicular, infraclavicular, or axillary lymphadenopathy noted on palpation.  Cardiovascular: Regular rate and rhythm without murmurs, rubs, or gallops. Respiratory: Clear to auscultation bilaterally. Chest expansion symmetric without accessory muscle use on inspiration or expiration.  GI: Abdomen soft and round. No tenderness to palpation. Bowel sounds normoactive in 4 quadrants. No hepatosplenomegaly.   GU: Deferred.  Musculoskeletal/Lymphatic: Muscle strength 5/5 in all extremities. Full ROM noted in all extremities. No evidence of lymphedema. Neuro: No focal deficits. Steady gait.  Psych: Mood and affect normal and appropriate for situation.  Extremities: No edema, cyanosis, or clubbing.  Skin: Warm and dry. No open lesions noted.   LABORATORY DATA:  None for this visit.   DIAGNOSTIC IMAGING:  None for this visit.      ASSESSMENT AND PLAN:   1. History of breast cancer:  Kathy Sherman will follow-up with her medical oncologist, Dr. Lindi Adie in July 2016 with history and physical exam per surveillance protocol.  She has elected not to pursue adjuvant anti-estrogen therapy at this time, given the  DCIS was ER/PR negative.   A comprehensive survivorship care plan and treatment summary was reviewed with the patient today detailing her breast cancer diagnosis, treatment course, potential late/long-term effects of treatment, appropriate follow-up care with recommendations for the future, and patient education resources.  A copy of this summary, along with a letter will be sent to the patient's primary care provider, will be mailed/routed after today's visit.  Ms. Oo is welcome to return to the Survivorship Clinic in the future, as needed; no follow-up will be scheduled at this time.    2. Bone health:  Given Ms. Heier's history of breast cancer and post-menopausal status, she is at risk for bone demineralization. She has chosen not to pursue adjuvant anti-estrogen therapy at this time, so she will be eligible for a DEXA scan when she is 59 years old, or sooner if clinically indicated. In addition, she was encouraged to increase her consumption of foods rich in calcium, as well as increase her weight-bearing activities.  She was given education on specific activities to promote bone health.  3. Cancer screening:  Due to Ms. Arquette's history and her age, she should receive screening for skin cancers, colon cancer, and gynecologic cancers.  The information and recommendations are listed on the patient's comprehensive  care plan/treatment summary and were reviewed in detail with the patient.  She expressed that she is anxious about having a colonoscopy as she is concerned about side effects during and after the procedure.  I offered her reassurance and stressed the importance of having a colonoscopy to screen for colorectal cancers.  She will discuss further with her PCP and will consider having a colonoscopy (she has never had one in the past).   4. Health maintenance and wellness promotion: Ms. Marrin was encouraged to consume 5-7 servings of fruits and vegetables per day. She was also encouraged to  engage in moderate to vigorous exercise for 30 minutes per day most days of the week. She was instructed to limit her alcohol consumption and continue to abstain from tobacco use.   5. Support services/counseling: It is not uncommon for this period of the patient's cancer care trajectory to be one of many emotions and stressors.  Ms. Straley was encouraged to take advantage of our many support services programs, support groups, and/or counseling in coping with her new life as a cancer survivor after completing anti-cancer treatment.  She was offered support today through active listening and expressive supportive counseling.  She was given information regarding our available services and encouraged to contact me with any questions or for help enrolling in any of our support group/programs.    A total of 45 minutes of face-to-face time was spent with this patient with greater than 50% of that time in counseling and care-coordination.   Mike Craze, NP Survivorship Program [place] [phone]    Note: PRIMARY CARE PROVIDER [name], [place] [phone]

## 2014-04-01 NOTE — Progress Notes (Addendum)
Kathy Sherman reports here for assessment today.  She denies any pain in her left breast, nor fatigue.  Hyperpigmentation noted in left breast, but skin intact.

## 2014-06-30 ENCOUNTER — Ambulatory Visit: Payer: 59 | Admitting: Hematology and Oncology

## 2014-07-07 ENCOUNTER — Ambulatory Visit (HOSPITAL_BASED_OUTPATIENT_CLINIC_OR_DEPARTMENT_OTHER): Payer: 59 | Admitting: Hematology and Oncology

## 2014-07-07 ENCOUNTER — Telehealth: Payer: Self-pay | Admitting: Hematology and Oncology

## 2014-07-07 VITALS — BP 136/69 | HR 86 | Temp 98.1°F | Resp 18 | Ht 59.0 in | Wt 121.4 lb

## 2014-07-07 DIAGNOSIS — C50112 Malignant neoplasm of central portion of left female breast: Secondary | ICD-10-CM

## 2014-07-07 DIAGNOSIS — Z853 Personal history of malignant neoplasm of breast: Secondary | ICD-10-CM | POA: Diagnosis not present

## 2014-07-07 NOTE — Assessment & Plan Note (Signed)
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 Breast exam 07/07/2014: Postradiation surgical changes 2. Mammogram will need to be done October 2016  Since she is ER/PR negative there is no role of antiestrogen therapy. Return to clinic once a year for follow-up breast exams.

## 2014-07-07 NOTE — Telephone Encounter (Signed)
Gave avs & calednar for June 2017.

## 2014-07-07 NOTE — Progress Notes (Signed)
Patient Care Team: [name], MD as PCP - General (Family Medicine) Nicholas Lose, MD as Consulting Physician (Hematology and Oncology) Alphonsa Overall, MD as Consulting Physician (General Surgery) Arloa Koh, MD as Consulting Physician (Radiation Oncology) Holley Bouche, NP as Nurse Practitioner (Nurse Practitioner)  DIAGNOSIS: Cancer of central portion of left female breast   Staging form: Breast, AJCC 7th Edition     Clinical stage from 12/03/2013: Stage 0 (Tis (DCIS), N0, M0) - Unsigned       Staging comments: Staged at breast conference on 11.18.15      Pathologic stage from 12/19/2013: Stage Unknown (Tis (DCIS), NX, cM0) - Signed by Rulon Eisenmenger, MD on 12/25/2013       Staging comments: Staging from final excisional path by Dr. Avis Epley    SUMMARY OF ONCOLOGIC HISTORY:   Cancer of central portion of left female breast   11/24/2013 Initial Biopsy DCIS with calcifications and necrosis ER 0%, PR 0%   12/01/2013 Breast MRI Left breast upper central portion 2.2 cm enhancement; incidental note of right renal cyst 2 cm   12/19/2013 Surgery Left breast lumpectomy: High-grade DCIS with comedonecrosis 1.5 cm margins negative ER 0% PR 0%    02/05/2014 - 03/02/2014 Radiation Therapy Left breast radiation therapy completed. 17 fractions, total dose: 4250 cGy.    CHIEF COMPLIANT: Follow-up of DCIS  INTERVAL HISTORY: Kathy Sherman is a 59 year old with above-mentioned history of left breast DCIS status post lumpectomy and radiation currently on observation. She is doing very well. She had recently traveled to several countries in [place] and return back yesterday. She feels well without any lumps or nodules in the breasts.  REVIEW OF SYSTEMS:   Constitutional: Denies fevers, chills or abnormal weight loss Eyes: Denies blurriness of vision Ears, nose, mouth, throat, and face: Denies mucositis or sore throat Respiratory: Denies cough, dyspnea or wheezes Cardiovascular: Denies palpitation,  chest discomfort or lower extremity swelling Gastrointestinal:  Denies nausea, heartburn or change in bowel habits Skin: Denies abnormal skin rashes Lymphatics: Denies new lymphadenopathy or easy bruising Neurological:Denies numbness, tingling or new weaknesses Behavioral/Psych: Mood is stable, no new changes  Breast:  denies any pain or lumps or nodules in either breasts All other systems were reviewed with the patient and are negative.  I have reviewed the past medical history, past surgical history, social history and family history with the patient and they are unchanged from previous note.  ALLERGIES:  has No Known Allergies.  MEDICATIONS:  Current Outpatient Prescriptions  Medication Sig Dispense Refill  . ferrous sulfate 325 (65 FE) MG tablet Take 325 mg by mouth daily with breakfast.     No current facility-administered medications for this visit.    PHYSICAL EXAMINATION: ECOG PERFORMANCE STATUS: 0 - Asymptomatic  Filed Vitals:   07/07/14 0921  BP: 136/69  Pulse: 86  Temp: 98.1 F (36.7 C)  Resp: 18   Filed Weights   07/07/14 0921  Weight: 121 lb 6.4 oz (55.067 kg)    GENERAL:alert, no distress and comfortable SKIN: skin color, texture, turgor are normal, no rashes or significant lesions EYES: normal, Conjunctiva are pink and non-injected, sclera clear OROPHARYNX:no exudate, no erythema and lips, buccal mucosa, and tongue normal  NECK: supple, thyroid normal size, non-tender, without nodularity LYMPH:  no palpable lymphadenopathy in the cervical, axillary or inguinal LUNGS: clear to auscultation and percussion with normal breathing effort HEART: regular rate & rhythm and no murmurs and no lower extremity edema ABDOMEN:abdomen soft, non-tender and normal bowel  sounds Musculoskeletal:no cyanosis of digits and no clubbing  NEURO: alert & oriented x 3 with fluent speech, no focal motor/sensory deficits BREAST: No palpable masses or nodules in either right or left  breasts. No palpable axillary supraclavicular or infraclavicular adenopathy no breast tenderness or nipple discharge. (exam performed in the presence of a chaperone)  LABORATORY DATA:  I have reviewed the data as listed   Chemistry      Component Value Date/Time   NA 142 12/03/2013 0833   K 3.8 12/03/2013 0833   CO2 27 12/03/2013 0833   BUN 15.3 12/03/2013 0833   CREATININE 0.8 12/03/2013 0833      Component Value Date/Time   CALCIUM 9.6 12/03/2013 0833   ALKPHOS 107 12/03/2013 0833   AST 16 12/03/2013 0833   ALT 13 12/03/2013 0833   BILITOT 0.47 12/03/2013 0833       Lab Results  Component Value Date   WBC 4.2 12/03/2013   HGB 14.0 12/03/2013   HCT 43.3 12/03/2013   MCV 84.0 12/03/2013   PLT 247 12/03/2013   NEUTROABS 2.1 12/03/2013   ASSESSMENT & PLAN:  Cancer of central portion of left female breast 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 Breast exam 07/07/2014: Postradiation surgical changes 2. Mammogram will need to be done October 2016  Since she is ER/PR negative there is no role of antiestrogen therapy. Return to clinic once a year for follow-up breast exams.      No orders of the defined types were placed in this encounter.   The patient has a good understanding of the overall plan. she agrees with it. she will call with any problems that may develop before the next visit here.   Rulon Eisenmenger, MD

## 2014-10-20 ENCOUNTER — Other Ambulatory Visit: Payer: Self-pay | Admitting: Family Medicine

## 2014-10-20 DIAGNOSIS — Z9889 Other specified postprocedural states: Secondary | ICD-10-CM

## 2014-10-20 DIAGNOSIS — Z853 Personal history of malignant neoplasm of breast: Secondary | ICD-10-CM

## 2014-11-06 ENCOUNTER — Ambulatory Visit
Admission: RE | Admit: 2014-11-06 | Discharge: 2014-11-06 | Disposition: A | Discharge status: Home / Self Care | Payer: 59 | Source: Ambulatory Visit | Attending: Family Medicine | Admitting: Family Medicine

## 2014-11-06 ENCOUNTER — Other Ambulatory Visit: Payer: Self-pay | Admitting: Family Medicine

## 2014-11-06 DIAGNOSIS — Z853 Personal history of malignant neoplasm of breast: Secondary | ICD-10-CM

## 2014-11-06 DIAGNOSIS — Z9889 Other specified postprocedural states: Secondary | ICD-10-CM

## 2015-02-17 ENCOUNTER — Encounter: Payer: Self-pay | Admitting: Adult Health

## 2015-02-17 NOTE — Progress Notes (Signed)
A birthday card was mailed to the patient today on behalf of the Survivorship Program at [facility].   Veera Stapleton, NP Survivorship Program [facility] [phone]  

## 2015-06-21 ENCOUNTER — Encounter: Payer: Self-pay | Admitting: Hematology and Oncology

## 2015-06-21 ENCOUNTER — Ambulatory Visit (HOSPITAL_BASED_OUTPATIENT_CLINIC_OR_DEPARTMENT_OTHER): Payer: 59 | Admitting: Hematology and Oncology

## 2015-06-21 ENCOUNTER — Telehealth: Payer: Self-pay | Admitting: Hematology and Oncology

## 2015-06-21 VITALS — BP 138/71 | HR 80 | Temp 98.4°F | Resp 18 | Ht 59.0 in | Wt 116.5 lb

## 2015-06-21 DIAGNOSIS — Z853 Personal history of malignant neoplasm of breast: Secondary | ICD-10-CM

## 2015-06-21 DIAGNOSIS — C50112 Malignant neoplasm of central portion of left female breast: Secondary | ICD-10-CM

## 2015-06-21 NOTE — Assessment & Plan Note (Signed)
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 Breast exam 06/21/2015: Postradiation surgical changes 2. Mammogram 11/06/2014: Benign, breast density category B  Since she is ER/PR negative there is no role of antiestrogen therapy. Return to clinic once a year for follow-up breast exams with survivorship clinic.

## 2015-06-21 NOTE — Telephone Encounter (Signed)
appt made and avs printed °

## 2015-06-21 NOTE — Progress Notes (Signed)
Patient Care Team: [name], MD as PCP - General (Family Medicine) Nicholas Lose, MD as Consulting Physician (Hematology and Oncology) Alphonsa Overall, MD as Consulting Physician (General Surgery) Arloa Koh, MD as Consulting Physician (Radiation Oncology) Holley Bouche, NP as Nurse Practitioner (Nurse Practitioner)  DIAGNOSIS: Cancer of central portion of left female breast [facility])   Staging form: Breast, AJCC 7th Edition     Clinical stage from 12/03/2013: Stage 0 (Tis (DCIS), N0, M0) - Unsigned       Staging comments: Staged at breast conference on 11.18.15      Pathologic stage from 12/19/2013: Stage Unknown (Tis (DCIS), NX, cM0) - Signed by Rulon Eisenmenger, MD on 12/25/2013       Staging comments: Staging from final excisional path by Dr. Avis Epley    SUMMARY OF ONCOLOGIC HISTORY:   Cancer of central portion of left female breast ([place])   11/24/2013 Initial Biopsy DCIS with calcifications and necrosis ER 0%, PR 0%   12/01/2013 Breast MRI Left breast upper central portion 2.2 cm enhancement; incidental note of right renal cyst 2 cm   12/19/2013 Surgery Left breast lumpectomy: High-grade DCIS with comedonecrosis 1.5 cm margins negative ER 0% PR 0%    02/05/2014 - 03/02/2014 Radiation Therapy Left breast radiation therapy completed. 17 fractions, total dose: 4250 cGy.    CHIEF COMPLIANT:  Follow-up of DCIS  INTERVAL HISTORY: Kathy Sherman is a patient with left breast DCIS treated with lumpectomy and radiation is currently on surveillance. Her husband passed away in 10-Mar-2022 from pneumonia. She complains of intermittent pain in the left axilla. Recent mammograms were normal. Denies any lumps or nodules in the breasts.  REVIEW OF SYSTEMS:   Constitutional: Denies fevers, chills or abnormal weight loss Eyes: Denies blurriness of vision Ears, nose, mouth, throat, and face: Denies mucositis or sore throat Respiratory: Denies cough, dyspnea or wheezes Cardiovascular: Denies  palpitation, chest discomfort Gastrointestinal:  Denies nausea, heartburn or change in bowel habits Skin: Denies abnormal skin rashes Lymphatics: Denies new lymphadenopathy or easy bruising Neurological:Denies numbness, tingling or new weaknesses Behavioral/Psych: Mood is stable, no new changes  Extremities: No lower extremity edema Breast: Occasional discomfort left axilla. All other systems were reviewed with the patient and are negative.  I have reviewed the past medical history, past surgical history, social history and family history with the patient and they are unchanged from previous note.  ALLERGIES:  has No Known Allergies.  MEDICATIONS:  Current Outpatient Prescriptions  Medication Sig Dispense Refill  . ferrous sulfate 325 (65 FE) MG tablet Take 325 mg by mouth daily with breakfast.     No current facility-administered medications for this visit.    PHYSICAL EXAMINATION: ECOG PERFORMANCE STATUS: 1 - Symptomatic but completely ambulatory  Filed Vitals:   06/21/15 0821  BP: 138/71  Pulse: 80  Temp: 98.4 F (36.9 C)  Resp: 18   Filed Weights   06/21/15 0821  Weight: 116 lb 8 oz (52.844 kg)    GENERAL:alert, no distress and comfortable SKIN: skin color, texture, turgor are normal, no rashes or significant lesions EYES: normal, Conjunctiva are pink and non-injected, sclera clear OROPHARYNX:no exudate, no erythema and lips, buccal mucosa, and tongue normal  NECK: supple, thyroid normal size, non-tender, without nodularity LYMPH:  no palpable lymphadenopathy in the cervical, axillary or inguinal LUNGS: clear to auscultation and percussion with normal breathing effort HEART: regular rate & rhythm and no murmurs and no lower extremity edema ABDOMEN:abdomen soft, non-tender and normal bowel sounds  MUSCULOSKELETAL:no cyanosis of digits and no clubbing  NEURO: alert & oriented x 3 with fluent speech, no focal motor/sensory deficits EXTREMITIES: No lower extremity  edema BREAST: No palpable masses or nodules in either right or left breasts. No palpable axillary supraclavicular or infraclavicular adenopathy no breast tenderness or nipple discharge. (exam performed in the presence of a chaperone)  LABORATORY DATA:  I have reviewed the data as listed   Chemistry      Component Value Date/Time   NA 142 12/03/2013 0833   K 3.8 12/03/2013 0833   CO2 27 12/03/2013 0833   BUN 15.3 12/03/2013 0833   CREATININE 0.8 12/03/2013 0833      Component Value Date/Time   CALCIUM 9.6 12/03/2013 0833   ALKPHOS 107 12/03/2013 0833   AST 16 12/03/2013 0833   ALT 13 12/03/2013 0833   BILITOT 0.47 12/03/2013 0833       Lab Results  Component Value Date   WBC 4.2 12/03/2013   HGB 14.0 12/03/2013   HCT 43.3 12/03/2013   MCV 84.0 12/03/2013   PLT 247 12/03/2013   NEUTROABS 2.1 12/03/2013     ASSESSMENT & PLAN:  Cancer of central portion of left female breast Left breast DCIS with Comedonecrosis ER 0% PR 0% status post left lumpectomy on 12/19/2013 status post radiation therapy completed 03/02/2014, currently on observation surveillance  Breast Cancer Surveillance: 1. Breast exam 06/21/2015: Postradiation surgical changes 2. Mammogram 11/06/2014: Benign, breast density category B  Since she is ER/PR negative there is no role of antiestrogen therapy. Return to clinic once a year for follow-up breast exams with survivorship clinic.   No orders of the defined types were placed in this encounter.   The patient has a good understanding of the overall plan. she agrees with it. she will call with any problems that may develop before the next visit here.   Rulon Eisenmenger, MD 06/21/2015

## 2015-07-06 ENCOUNTER — Ambulatory Visit: Payer: 59 | Admitting: Hematology and Oncology

## 2015-10-05 ENCOUNTER — Other Ambulatory Visit: Payer: Self-pay | Admitting: Family Medicine

## 2015-10-05 DIAGNOSIS — Z853 Personal history of malignant neoplasm of breast: Secondary | ICD-10-CM

## 2015-11-10 ENCOUNTER — Ambulatory Visit
Admission: RE | Admit: 2015-11-10 | Discharge: 2015-11-10 | Disposition: A | Discharge status: Home / Self Care | Payer: 59 | Source: Ambulatory Visit | Attending: Family Medicine | Admitting: Family Medicine

## 2015-11-10 DIAGNOSIS — Z853 Personal history of malignant neoplasm of breast: Secondary | ICD-10-CM

## 2016-02-08 IMAGING — MG MM DIAGNOSTIC UNILATERAL L
4 series · 4 of 4 positions shown · non-contrast
Comparison: With priors.

CLINICAL DATA: History of left breast cancer status post
lumpectomy. Pre radiation exam for residual calcifications.

EXAM:
DIGITAL DIAGNOSTIC  LEFT MAMMOGRAM WITH CAD

[L CC (1 of 2)]
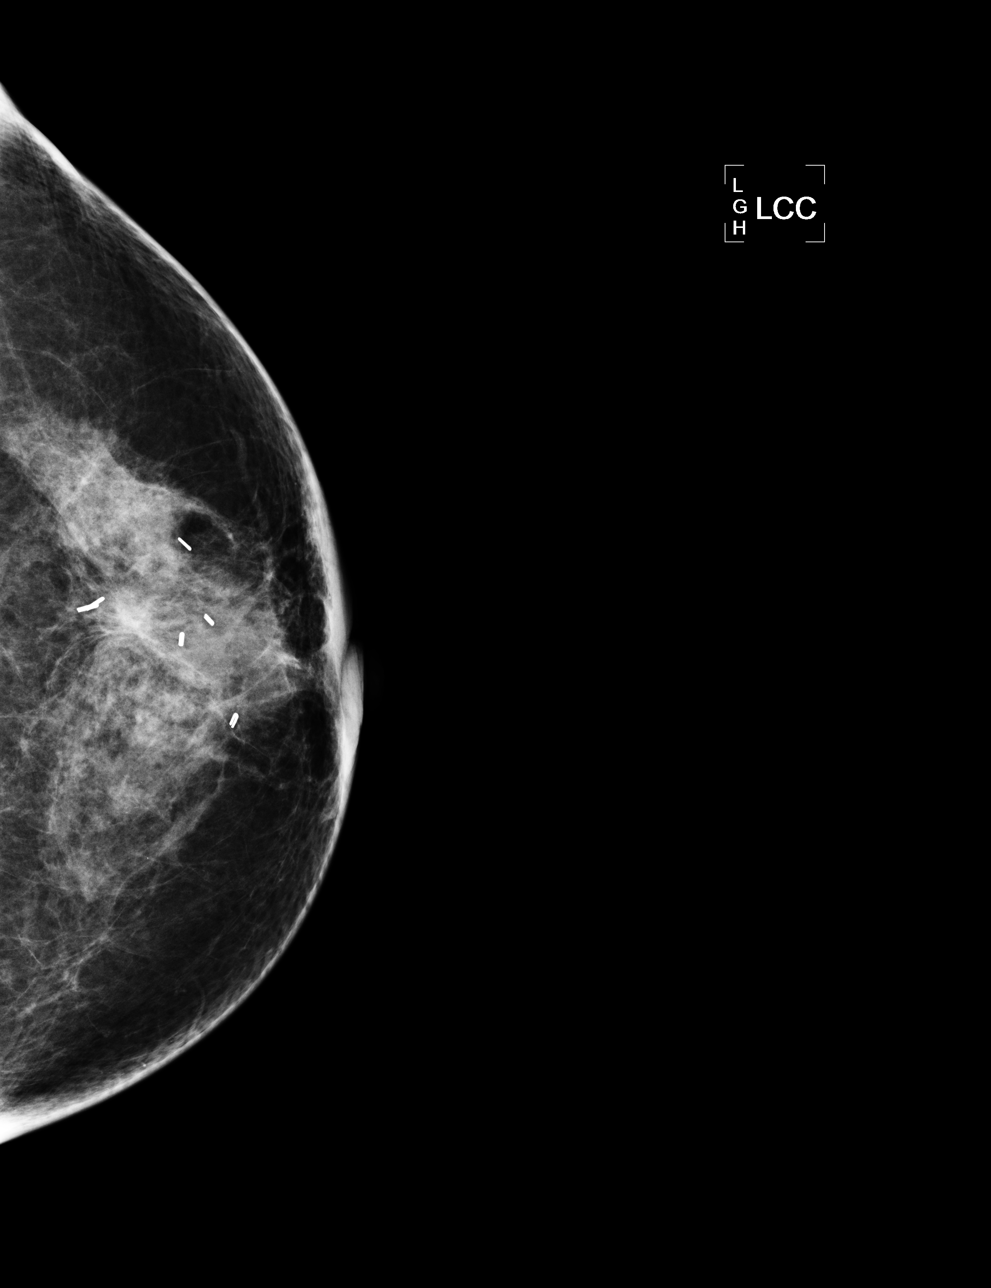

[L MLO]
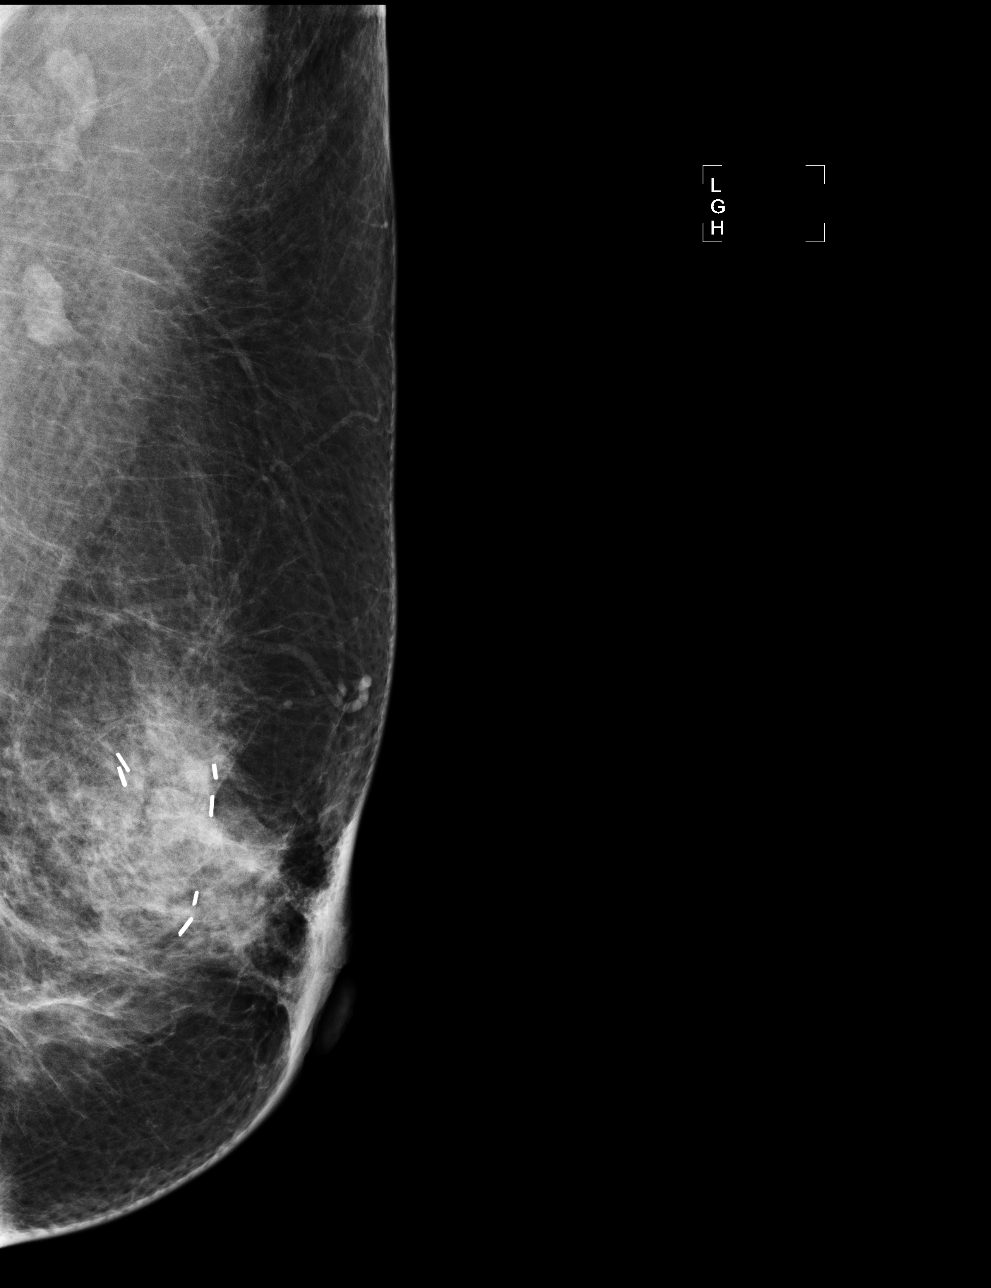

[L CC (2 of 2)]
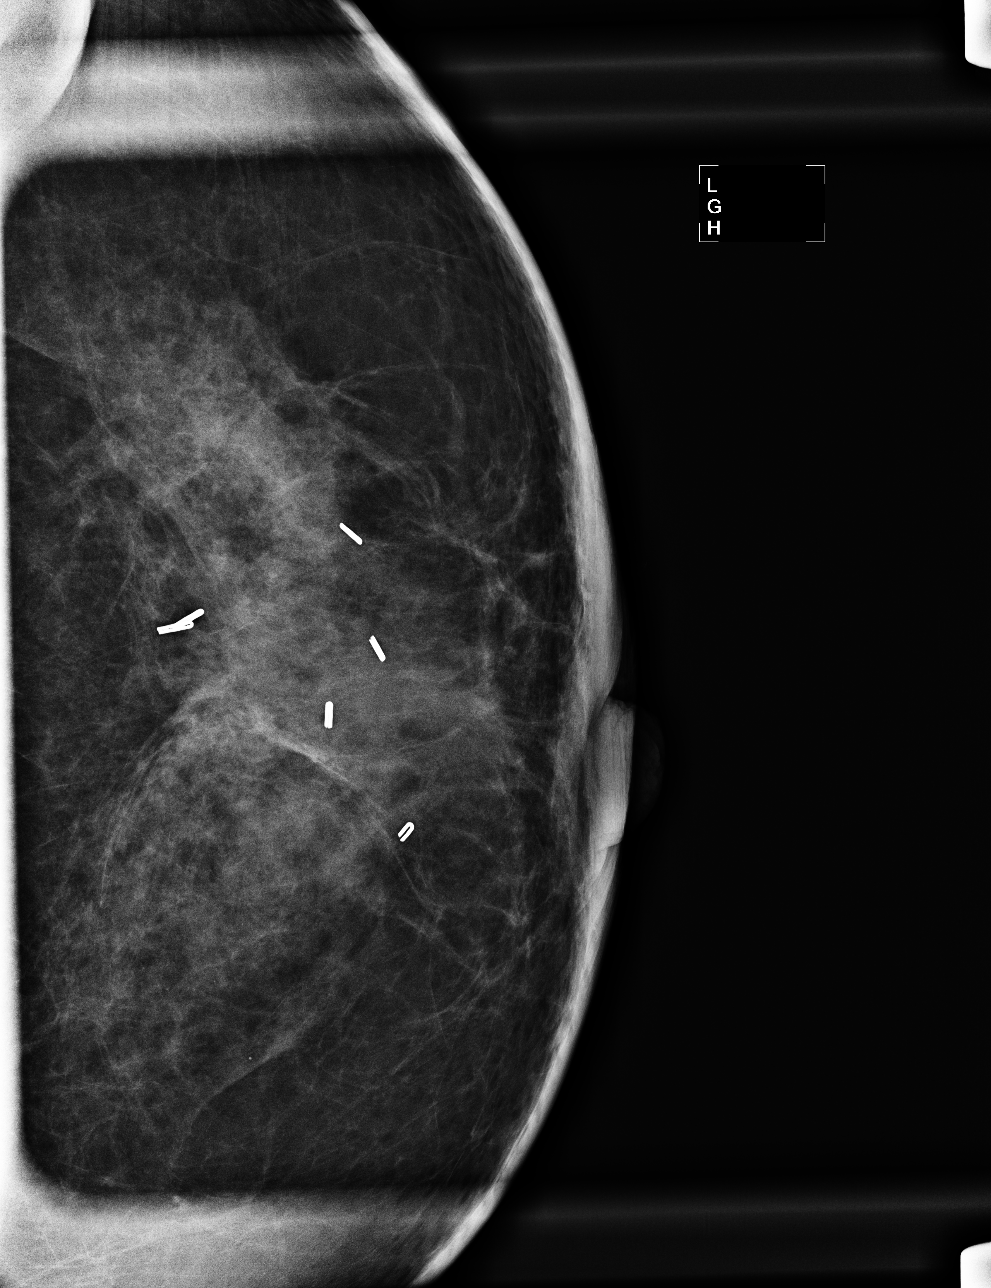

[L ML]
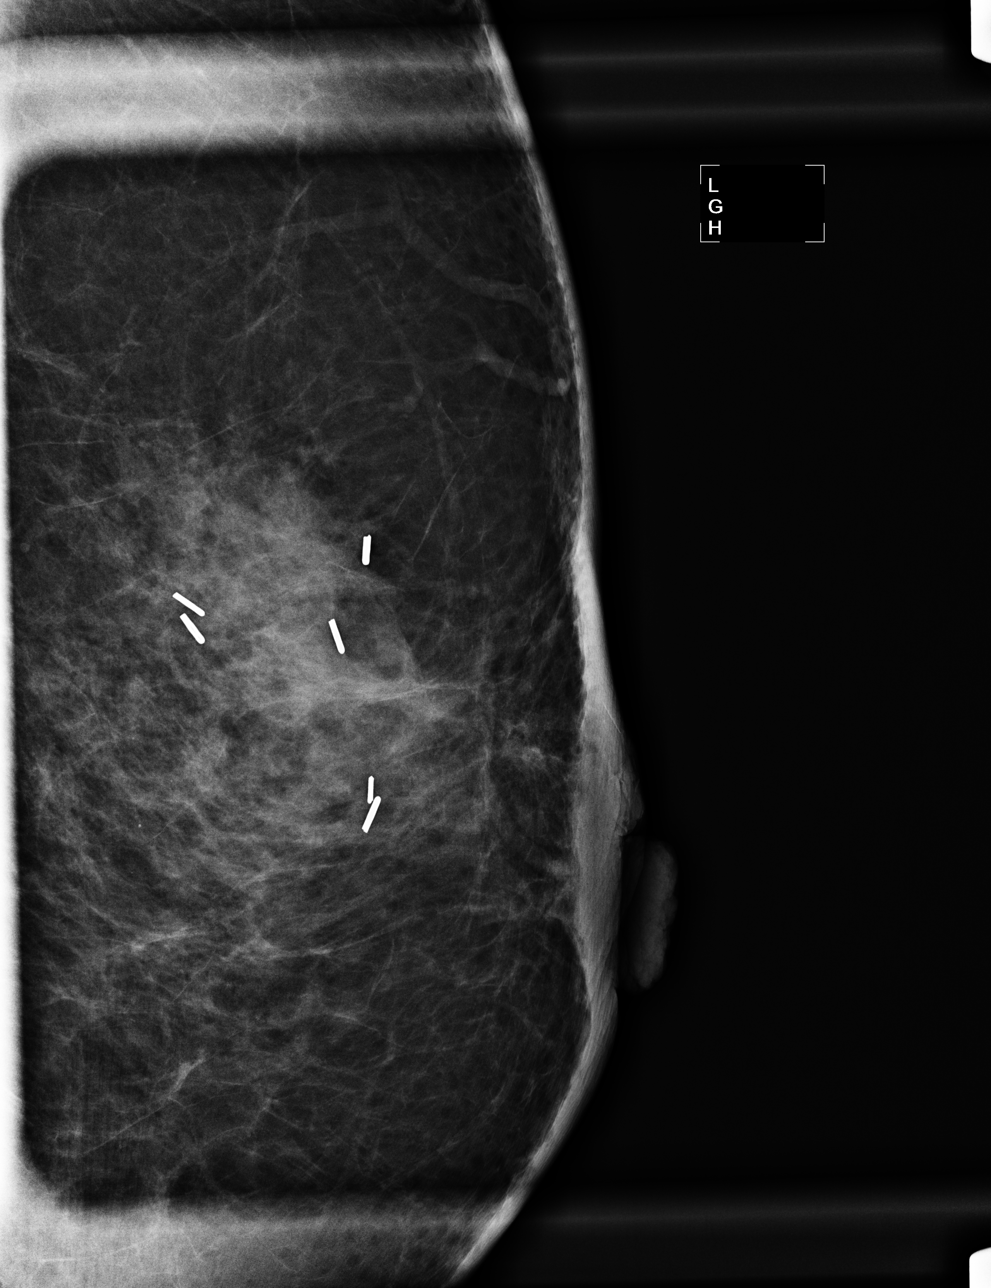

[4 of 4 positions shown; findings below may reference images not displayed]

ACR Breast Density Category c: The breast tissue is heterogeneously
dense, which may obscure small masses.
FINDINGS: There are lumpectomy changes in the left breast. There is no
suspicious mass or malignant type microcalcifications.

Mammographic images were processed with CAD.
IMPRESSION: Lumpectomy changes in left breast.  No evidence of malignancy.

RECOMMENDATION:
Bilateral diagnostic mammogram in Friday October, 2014 is recommended.

I have discussed the findings and recommendations with the patient.
Results were also provided in writing at the conclusion of the
visit. If applicable, a reminder letter will be sent to the patient
regarding the next appointment.

BI-RADS CATEGORY  2: Benign Finding(s)

## 2016-06-15 DIAGNOSIS — Z0000 Encounter for general adult medical examination without abnormal findings: Secondary | ICD-10-CM | POA: Diagnosis not present

## 2016-06-20 ENCOUNTER — Ambulatory Visit (HOSPITAL_BASED_OUTPATIENT_CLINIC_OR_DEPARTMENT_OTHER): Payer: 59 | Admitting: Adult Health

## 2016-06-20 ENCOUNTER — Other Ambulatory Visit: Payer: Self-pay | Admitting: *Deleted

## 2016-06-20 ENCOUNTER — Encounter: Payer: 59 | Admitting: Nurse Practitioner

## 2016-06-20 ENCOUNTER — Encounter: Payer: Self-pay | Admitting: Adult Health

## 2016-06-20 VITALS — BP 125/54 | HR 79 | Temp 98.1°F | Resp 18 | Ht 59.0 in | Wt 118.8 lb

## 2016-06-20 DIAGNOSIS — C50112 Malignant neoplasm of central portion of left female breast: Secondary | ICD-10-CM

## 2016-06-20 DIAGNOSIS — Z853 Personal history of malignant neoplasm of breast: Secondary | ICD-10-CM

## 2016-06-20 DIAGNOSIS — Z171 Estrogen receptor negative status [ER-]: Principal | ICD-10-CM

## 2016-06-20 NOTE — Progress Notes (Signed)
CLINIC:  Survivorship   REASON FOR VISIT:  Routine follow-up for history of breast cancer.   BRIEF ONCOLOGIC HISTORY:    Cancer of central portion of left female breast ([place])   11/24/2013 Initial Biopsy    DCIS with calcifications and necrosis ER 0%, PR 0%      12/01/2013 Breast MRI    Left breast upper central portion 2.2 cm enhancement; incidental note of right renal cyst 2 cm      12/19/2013 Surgery    Left breast lumpectomy: High-grade DCIS with comedonecrosis 1.5 cm margins negative ER 0% PR 0%       02/05/2014 - 03/02/2014 Radiation Therapy    Left breast radiation therapy completed. 17 fractions, total dose: 4250 cGy.        INTERVAL HISTORY:  Ms. Badalamenti presents to the Survivorship Clinic today for routine follow-up for her history of breast cancer.  Overall, she reports feeling quite well. She has no questions.  She has started seeing a new PCP and recently found out she has vitamin d deficiency.  She is going to start supplementation.  She has no breast concerns.  She sees gynecologist annually.  She does not routinely have a skin cancer screening.  She has undergone cologuard screening.  She exercises by walking on the weekends throughout her neighborhood.      REVIEW OF SYSTEMS:  Review of Systems  Constitutional: Negative for appetite change, chills, diaphoresis, fatigue, fever and unexpected weight change.  HENT:   Negative for hearing loss and lump/mass.   Eyes: Negative for eye problems and icterus.  Respiratory: Negative for chest tightness, cough and shortness of breath.   Cardiovascular: Negative for chest pain, leg swelling and palpitations.  Gastrointestinal: Negative for abdominal distention, abdominal pain, constipation, diarrhea, nausea and vomiting.  Endocrine: Negative for hot flashes.  Genitourinary: Negative for difficulty urinating.   Musculoskeletal: Negative for arthralgias.  Skin: Negative for itching and rash.  Neurological: Negative for  dizziness, extremity weakness and headaches.  Hematological: Negative for adenopathy.  Psychiatric/Behavioral: Negative for depression. The patient is not nervous/anxious.   Breast: Denies any new nodularity, masses, tenderness, nipple changes, or nipple discharge.   PAST MEDICAL/SURGICAL HISTORY:  Past Medical History:  Diagnosis Date  . Breast cancer ([place])   . PONV (postoperative nausea and vomiting)   . S/P radiation therapy 02/05/14-03/02/14   left breast/4250Gy/17 sessions  . Wears glasses   . Wears partial dentures    top   Past Surgical History:  Procedure Laterality Date  . BREAST LUMPECTOMY WITH RADIOACTIVE SEED LOCALIZATION Left 12/19/2013   Procedure: LEFT BREAST LUMPECTOMY WITH RADIOACTIVE SEED LOCALIZATION;  Surgeon: Alphonsa Overall, MD;  Location: [facility];  Service: General;  Laterality: Left;  Marland Kitchen MULTIPLE TOOTH EXTRACTIONS     top  . ORIF WRIST FRACTURE  4/15   left     ALLERGIES:  No Known Allergies   CURRENT MEDICATIONS:  Outpatient Encounter Prescriptions as of 06/20/2016  Medication Sig  . ferrous sulfate 325 (65 FE) MG tablet Take 325 mg by mouth daily with breakfast.   No facility-administered encounter medications on file as of 06/20/2016.      ONCOLOGIC FAMILY HISTORY:  Family History  Problem Relation Age of Onset  . Hypertension Brother     GENETIC COUNSELING/TESTING: Not indicated  SOCIAL HISTORY:  Kathy Sherman is widowed and lives with her son in [place], [place].  She has 1 son who lives with her.  Ms. Birdsell is  currently working full time at [employer] as a Therapist, sports.  She denies any current or history of tobacco, alcohol, or illicit drug use.     PHYSICAL EXAMINATION:  Vital Signs: Vitals:   06/20/16 0846  BP: (!) 125/54  Pulse: 79  Resp: 18  Temp: 98.1 F (36.7 C)   Filed Weights   06/20/16 0846  Weight: 118 lb 12.8 oz (53.9 kg)   General: Well-nourished, well-appearing female in no acute  distress.  Unaccompanied today.   HEENT: Head is normocephalic.  Pupils equal and reactive to light. Conjunctivae clear without exudate.  Sclerae anicteric. Oral mucosa is pink, moist.  Oropharynx is pink without lesions or erythema.  Lymph: No cervical, supraclavicular, or infraclavicular lymphadenopathy noted on palpation.  Cardiovascular: Regular rate and rhythm.Marland Kitchen Respiratory: Clear to auscultation bilaterally. Chest expansion symmetric; breathing non-labored.  Breast Exam:  -Left breast: No appreciable masses on palpation. No skin redness, thickening, or peau d'orange appearance; no nipple retraction or nipple discharge; mild distortion in symmetry at previous lumpectomy site well healed scar without erythema or nodularity.  -Right breast: No appreciable masses on palpation. No skin redness, thickening, or peau d'orange appearance; no nipple retraction or nipple discharge;  -Axilla: No axillary adenopathy bilaterally.  GI: Abdomen soft and round; non-tender, non-distended. Bowel sounds normoactive. No hepatosplenomegaly.   GU: Deferred.  Neuro: No focal deficits. Steady gait.  Psych: Mood and affect normal and appropriate for situation.  Extremities: No edema. Skin: Warm and dry.  LABORATORY DATA:  None for this visit   DIAGNOSTIC IMAGING:  Most recent mammogram:     ASSESSMENT AND PLAN:  Ms.. Congleton is a pleasant 61 y.o. female with history of Stage 0 left breast DCIS, ER-/PR-/HER2-, diagnosed in 11/2013, treated with lumpectomy, and adjuvant radiation therapy.  She presents to the Survivorship Clinic for surveillance and routine follow-up.   1. History of breast cancer:  Ms. Pfahler is currently clinically and radiographically without evidence of disease or recurrence of breast cancer. She will be due for mammogram in 10/2016; orders placed today.  I encouraged her to call me with any questions or concerns before her next visit at the cancer center, and I would be happy to see her  sooner, if needed.    2. Bone health:  Given Ms. Dettinger's age, history of breast cancer she is at slight risk for bone demineralization.   She was given education on specific food and activities to promote bone health.  3. Cancer screening:  Due to Ms. Matteucci's history and her age, she should receive screening for skin cancers, colon cancer, and gynecologic cancers. She was encouraged to follow-up with her PCP for appropriate cancer screenings.   4. Health maintenance and wellness promotion: Ms. Biscoe was encouraged to consume 5-7 servings of fruits and vegetables per day. She was also encouraged to engage in moderate to vigorous exercise for 30 minutes per day most days of the week. She was instructed to limit her alcohol consumption and continue to abstain from tobacco use.    Dispo:  -Return to cancer center in one year for LTS follow up -Mammogram in 10/2016 when due   A total of (30) minutes of face-to-face time was spent with this patient with greater than 50% of that time in counseling and care-coordination.   Gardenia Phlegm, NP Survivorship Program [place] [phone]   Note: PRIMARY CARE PROVIDER [name], [place] [phone]

## 2016-09-19 DIAGNOSIS — E785 Hyperlipidemia, unspecified: Secondary | ICD-10-CM | POA: Diagnosis not present

## 2016-09-19 DIAGNOSIS — E559 Vitamin D deficiency, unspecified: Secondary | ICD-10-CM | POA: Diagnosis not present

## 2016-10-27 DIAGNOSIS — Z1231 Encounter for screening mammogram for malignant neoplasm of breast: Secondary | ICD-10-CM | POA: Diagnosis not present

## 2016-10-27 DIAGNOSIS — Z01419 Encounter for gynecological examination (general) (routine) without abnormal findings: Secondary | ICD-10-CM | POA: Diagnosis not present

## 2016-10-27 DIAGNOSIS — Z6824 Body mass index (BMI) 24.0-24.9, adult: Secondary | ICD-10-CM | POA: Diagnosis not present

## 2016-11-01 DIAGNOSIS — Z1211 Encounter for screening for malignant neoplasm of colon: Secondary | ICD-10-CM | POA: Diagnosis not present

## 2016-11-01 DIAGNOSIS — Z1212 Encounter for screening for malignant neoplasm of rectum: Secondary | ICD-10-CM | POA: Diagnosis not present

## 2016-11-10 ENCOUNTER — Ambulatory Visit
Admission: RE | Admit: 2016-11-10 | Discharge: 2016-11-10 | Disposition: A | Discharge status: Home / Self Care | Payer: 59 | Source: Ambulatory Visit | Attending: Adult Health | Admitting: Adult Health

## 2016-11-10 DIAGNOSIS — Z171 Estrogen receptor negative status [ER-]: Principal | ICD-10-CM

## 2016-11-10 DIAGNOSIS — C50112 Malignant neoplasm of central portion of left female breast: Secondary | ICD-10-CM

## 2016-11-10 DIAGNOSIS — R922 Inconclusive mammogram: Secondary | ICD-10-CM | POA: Diagnosis not present

## 2016-11-10 HISTORY — DX: Personal history of irradiation: Z92.3

## 2017-06-15 DIAGNOSIS — Z0000 Encounter for general adult medical examination without abnormal findings: Secondary | ICD-10-CM | POA: Diagnosis not present

## 2017-06-15 DIAGNOSIS — E559 Vitamin D deficiency, unspecified: Secondary | ICD-10-CM | POA: Diagnosis not present

## 2017-06-20 ENCOUNTER — Encounter: Payer: Self-pay | Admitting: Adult Health

## 2017-06-20 ENCOUNTER — Inpatient Hospital Stay: Payer: 59 | Attending: Adult Health | Admitting: Adult Health

## 2017-06-20 VITALS — BP 151/94 | HR 91 | Temp 98.6°F | Resp 18 | Ht 59.0 in | Wt 118.5 lb

## 2017-06-20 DIAGNOSIS — Z86000 Personal history of in-situ neoplasm of breast: Secondary | ICD-10-CM | POA: Diagnosis not present

## 2017-06-20 DIAGNOSIS — Z171 Estrogen receptor negative status [ER-]: Secondary | ICD-10-CM

## 2017-06-20 DIAGNOSIS — C50112 Malignant neoplasm of central portion of left female breast: Secondary | ICD-10-CM

## 2017-06-20 NOTE — Patient Instructions (Signed)
Bone Health Bones protect organs, store calcium, and anchor muscles. Good health habits, such as eating nutritious foods and exercising regularly, are important for maintaining healthy bones. They can also help to prevent a condition that causes bones to lose density and become weak and brittle (osteoporosis). Why is bone mass important? Bone mass refers to the amount of bone tissue that you have. The higher your bone mass, the stronger your bones. An important step toward having healthy bones throughout life is to have strong and dense bones during childhood. A young adult who has a high bone mass is more likely to have a high bone mass later in life. Bone mass at its greatest it is called peak bone mass. A large decline in bone mass occurs in older adults. In women, it occurs about the time of menopause. During this time, it is important to practice good health habits, because if more bone is lost than what is replaced, the bones will become less healthy and more likely to break (fracture). If you find that you have a low bone mass, you may be able to prevent osteoporosis or further bone loss by changing your diet and lifestyle. How can I find out if my bone mass is low? Bone mass can be measured with an X-ray test that is called a bone mineral density (BMD) test. This test is recommended for all women who are age 65 or older. It may also be recommended for men who are age 70 or older, or for people who are more likely to develop osteoporosis due to:  Having bones that break easily.  Having a long-term disease that weakens bones, such as kidney disease or rheumatoid arthritis.  Having menopause earlier than normal.  Taking medicine that weakens bones, such as steroids, thyroid hormones, or hormone treatment for breast cancer or prostate cancer.  Smoking.  Drinking three or more alcoholic drinks each day.  What are the nutritional recommendations for healthy bones? To have healthy bones, you  need to get enough of the right minerals and vitamins. Most nutrition experts recommend getting these nutrients from the foods that you eat. Nutritional recommendations vary from person to person. Ask your health care provider what is healthy for you. Here are some general guidelines. Calcium Recommendations Calcium is the most important (essential) mineral for bone health. Most people can get enough calcium from their diet, but supplements may be recommended for people who are at risk for osteoporosis. Good sources of calcium include:  Dairy products, such as low-fat or nonfat milk, cheese, and yogurt.  Dark green leafy vegetables, such as bok choy and broccoli.  Calcium-fortified foods, such as orange juice, cereal, bread, soy beverages, and tofu products.  Nuts, such as almonds.  Follow these recommended amounts for daily calcium intake:  Children, age 1?3: 700 mg.  Children, age 4?8: 1,000 mg.  Children, age 9?13: 1,300 mg.  Teens, age 14?18: 1,300 mg.  Adults, age 19?50: 1,000 mg.  Adults, age 51?70: ? Men: 1,000 mg. ? Women: 1,200 mg.  Adults, age 71 or older: 1,200 mg.  Pregnant and breastfeeding females: ? Teens: 1,300 mg. ? Adults: 1,000 mg.  Vitamin D Recommendations Vitamin D is the most essential vitamin for bone health. It helps the body to absorb calcium. Sunlight stimulates the skin to make vitamin D, so be sure to get enough sunlight. If you live in a cold climate or you do not get outside often, your health care provider may recommend that you take vitamin   D supplements. Good sources of vitamin D in your diet include:  Egg yolks.  Saltwater fish.  Milk and cereal fortified with vitamin D.  Follow these recommended amounts for daily vitamin D intake:  Children and teens, age 1?18: 600 international units.  Adults, age 50 or younger: 400-800 international units.  Adults, age 51 or older: 800-1,000 international units.  Other Nutrients Other nutrients  for bone health include:  Phosphorus. This mineral is found in meat, poultry, dairy foods, nuts, and legumes. The recommended daily intake for adult men and adult women is 700 mg.  Magnesium. This mineral is found in seeds, nuts, dark green vegetables, and legumes. The recommended daily intake for adult men is 400?420 mg. For adult women, it is 310?320 mg.  Vitamin K. This vitamin is found in green leafy vegetables. The recommended daily intake is 120 mg for adult men and 90 mg for adult women.  What type of physical activity is best for building and maintaining healthy bones? Weight-bearing and strength-building activities are important for building and maintaining peak bone mass. Weight-bearing activities cause muscles and bones to work against gravity. Strength-building activities increases muscle strength that supports bones. Weight-bearing and muscle-building activities include:  Walking and hiking.  Jogging and running.  Dancing.  Gym exercises.  Lifting weights.  Tennis and racquetball.  Climbing stairs.  Aerobics.  Adults should get at least 30 minutes of moderate physical activity on most days. Children should get at least 60 minutes of moderate physical activity on most days. Ask your health care provide what type of exercise is best for you. Where can I find more information? For more information, check out the following websites:  National Osteoporosis Foundation: http://nof.org/learn/basics  National Institutes of Health: http://www.niams.nih.gov/Health_Info/Bone/Bone_Health/bone_health_for_life.asp  This information is not intended to replace advice given to you by your health care provider. Make sure you discuss any questions you have with your health care provider. Document Released: 03/25/2003 Document Revised: 07/23/2015 Document Reviewed: 01/07/2014 Elsevier Interactive Patient Education  2018 Elsevier Inc.  

## 2017-06-20 NOTE — Progress Notes (Signed)
CLINIC:  Survivorship   REASON FOR VISIT:  Routine follow-up for history of breast cancer.   BRIEF ONCOLOGIC HISTORY:    Cancer of central portion of left female breast ([place])   11/24/2013 Initial Biopsy    DCIS with calcifications and necrosis ER 0%, PR 0%      12/01/2013 Breast MRI    Left breast upper central portion 2.2 cm enhancement; incidental note of right renal cyst 2 cm      12/19/2013 Surgery    Left breast lumpectomy: High-grade DCIS with comedonecrosis 1.5 cm margins negative ER 0% PR 0%       02/05/2014 - 03/02/2014 Radiation Therapy    Left breast radiation therapy completed. 17 fractions, total dose: 4250 cGy.        INTERVAL HISTORY:  Kathy Sherman presents to the Survivorship Clinic today for routine follow-up for her history of breast cancer.  Overall, she reports feeling quite well. She sees her PCP regularly and has a full panel of labs at that time.  She is up to date with mammogram, colon cancer screenings.  She has not had a skin exam.    Halana exercises by gardening a few times per week.      REVIEW OF SYSTEMS:  Review of Systems  Constitutional: Negative for appetite change, chills, fatigue, fever and unexpected weight change.  HENT:   Negative for hearing loss, lump/mass and trouble swallowing.   Eyes: Negative for eye problems and icterus.  Respiratory: Negative for chest tightness, cough and shortness of breath.   Cardiovascular: Negative for chest pain, leg swelling and palpitations.  Gastrointestinal: Negative for abdominal distention, abdominal pain, constipation, diarrhea, nausea and vomiting.  Endocrine: Negative for hot flashes.  Skin: Negative for itching and rash.  Neurological: Negative for dizziness, extremity weakness, headaches and numbness.  Hematological: Negative for adenopathy. Does not bruise/bleed easily.  Psychiatric/Behavioral: Negative for depression. The patient is not nervous/anxious.   Breast: Denies any new nodularity,  masses, tenderness, nipple changes, or nipple discharge.       PAST MEDICAL/SURGICAL HISTORY:  Past Medical History:  Diagnosis Date  . Breast cancer ([place])   . Personal history of radiation therapy 02/16/2014  . PONV (postoperative nausea and vomiting)   . S/P radiation therapy 02/05/14-03/02/14   left breast/4250Gy/17 sessions  . Wears glasses   . Wears partial dentures    top   Past Surgical History:  Procedure Laterality Date  . BREAST LUMPECTOMY Left 12/16/2013  . BREAST LUMPECTOMY WITH RADIOACTIVE SEED LOCALIZATION Left 12/19/2013   Procedure: LEFT BREAST LUMPECTOMY WITH RADIOACTIVE SEED LOCALIZATION;  Surgeon: Alphonsa Overall, MD;  Location: [facility];  Service: General;  Laterality: Left;  Marland Kitchen MULTIPLE TOOTH EXTRACTIONS     top  . ORIF WRIST FRACTURE  4/15   left     ALLERGIES:  No Known Allergies   CURRENT MEDICATIONS:  Outpatient Encounter Medications as of 06/20/2017  Medication Sig  . [DISCONTINUED] Ergocalciferol (VITAMIN D2 PO) Take 50,000 Int'l Units by mouth once a week.   No facility-administered encounter medications on file as of 06/20/2017.      ONCOLOGIC FAMILY HISTORY:  Family History  Problem Relation Age of Onset  . Hypertension Brother        SOCIAL HISTORY:  Social History   Socioeconomic History  . Marital status: Married    Spouse name: Not on file  . Number of children: Not on file  . Years of education: Not on file  . Highest  education level: Not on file  Occupational History  . Not on file  Social Needs  . Financial resource strain: Not on file  . Food insecurity:    Worry: Not on file    Inability: Not on file  . Transportation needs:    Medical: Not on file    Non-medical: Not on file  Tobacco Use  . Smoking status: Never Smoker  . Smokeless tobacco: Never Used  Substance and Sexual Activity  . Alcohol use: No  . Drug use: No  . Sexual activity: Not on file    Comment: menopause 05/2009, no HRT    Lifestyle  . Physical activity:    Days per week: Not on file    Minutes per session: Not on file  . Stress: Not on file  Relationships  . Social connections:    Talks on phone: Not on file    Gets together: Not on file    Attends religious service: Not on file    Active member of club or organization: Not on file    Attends meetings of clubs or organizations: Not on file    Relationship status: Not on file  . Intimate partner violence:    Fear of current or ex partner: Not on file    Emotionally abused: Not on file    Physically abused: Not on file    Forced sexual activity: Not on file  Other Topics Concern  . Not on file  Social History Narrative  . Not on file       PHYSICAL EXAMINATION:  Vital Signs: Vitals:   06/20/17 0833  BP: (!) 151/94  Pulse: 91  Resp: 18  Temp: 98.6 F (37 C)  SpO2: 100%   Filed Weights   06/20/17 0833  Weight: 118 lb 8 oz (53.8 kg)   General: Well-nourished, well-appearing female in no acute distress.  Unaccompanied today.   HEENT: Head is normocephalic.  Pupils equal and reactive to light. Conjunctivae clear without exudate.  Sclerae anicteric. Oral mucosa is pink, moist.  Oropharynx is pink without lesions or erythema.  Lymph: No cervical, supraclavicular, or infraclavicular lymphadenopathy noted on palpation.  Cardiovascular: Regular rate and rhythm.Marland Kitchen Respiratory: Clear to auscultation bilaterally. Chest expansion symmetric; breathing non-labored.  Breast Exam:  -Left breast: No appreciable masses on palpation. No skin redness, thickening, or peau d'orange appearance; no nipple retraction or nipple discharge; mild distortion in symmetry at previous lumpectomy site well healed scar without erythema or nodularity.  -Right breast: No appreciable masses on palpation. No skin redness, thickening, or peau d'orange appearance; no nipple retraction or nipple discharge;  -Axilla: No axillary adenopathy bilaterally.  GI: Abdomen soft and  round; non-tender, non-distended. Bowel sounds normoactive. No hepatosplenomegaly.   GU: Deferred.  Neuro: No focal deficits. Steady gait.  Psych: Mood and affect normal and appropriate for situation.  MSK: No focal spinal tenderness to palpation, full range of motion in bilateral upper extremities Extremities: No edema. Skin: Warm and dry.  LABORATORY DATA:  None for this visit   DIAGNOSTIC IMAGING:  Most recent mammogram:     ASSESSMENT AND PLAN:  Ms.. Lizardo is a pleasant 62 y.o. female with history of Stage 0 left breast invasive ductal carcinoma, ER-/PR-, diagnosed in 11/2013, treated with lumpectomy and adjuvant radiation therapy.  She presents to the Survivorship Clinic for surveillance and routine follow-up.   1. History of breast cancer:  Ms. Mcchristian is currently clinically and radiographically without evidence of disease or recurrence of breast cancer.  She will be due for mammogram in 10/2017; orders placed today.  She will return to [facility] in one year for continued surveillance and monitoring.  I encouraged her to call me with any questions or concerns before her next visit at the cancer center, and I would be happy to see her sooner, if needed.    2. Bone health:  Given Ms. Mauney's age, history of breast cancer, she is at risk for bone demineralization. She was given education on specific food and activities to promote bone health.  I encouraged her to f/u with her PCP regarding bone density testing.    3. Cancer screening:  Due to Ms. Barraclough's history and her age, she should receive screening for skin cancers, colon cancer, and gynecologic cancers. She was encouraged to follow-up with her PCP for appropriate cancer screenings.   4. Health maintenance and wellness promotion: Ms. Sowder was encouraged to consume 5-7 servings of fruits and vegetables per day. She was also encouraged to engage in moderate to vigorous exercise for 30 minutes per day most days of the week. She was  instructed to limit her alcohol consumption and continue to abstain from tobacco use.    Dispo:  -Return to cancer center in one year for LTS follow up -Mammogram due in 10/2017   A total of (30) minutes of face-to-face time was spent with this patient with greater than 50% of that time in counseling and care-coordination.   Gardenia Phlegm, NP Survivorship Program [facility] [phone]   Note: PRIMARY CARE PROVIDER Ernestene Kiel, [place] [phone]

## 2017-11-12 ENCOUNTER — Ambulatory Visit
Admission: RE | Admit: 2017-11-12 | Discharge: 2017-11-12 | Disposition: A | Discharge status: Home / Self Care | Payer: BLUE CROSS/BLUE SHIELD | Source: Ambulatory Visit | Attending: Adult Health | Admitting: Adult Health

## 2017-11-12 DIAGNOSIS — Z171 Estrogen receptor negative status [ER-]: Principal | ICD-10-CM

## 2017-11-12 DIAGNOSIS — C50112 Malignant neoplasm of central portion of left female breast: Secondary | ICD-10-CM

## 2018-06-06 ENCOUNTER — Telehealth: Payer: Self-pay | Admitting: Adult Health

## 2018-06-06 NOTE — Telephone Encounter (Signed)
Called regarding phone visit

## 2018-06-20 ENCOUNTER — Telehealth: Payer: Self-pay | Admitting: Adult Health

## 2018-06-20 NOTE — Telephone Encounter (Signed)
Contacted pt to verify telephone visit for pre reg °

## 2018-06-21 ENCOUNTER — Encounter: Payer: Self-pay | Admitting: Adult Health

## 2018-06-21 ENCOUNTER — Inpatient Hospital Stay: Payer: 59 | Attending: Adult Health | Admitting: Adult Health

## 2018-06-21 DIAGNOSIS — Z171 Estrogen receptor negative status [ER-]: Secondary | ICD-10-CM | POA: Diagnosis not present

## 2018-06-21 DIAGNOSIS — Z923 Personal history of irradiation: Secondary | ICD-10-CM | POA: Diagnosis not present

## 2018-06-21 DIAGNOSIS — C50112 Malignant neoplasm of central portion of left female breast: Secondary | ICD-10-CM | POA: Diagnosis not present

## 2018-06-21 NOTE — Progress Notes (Signed)
SURVIVORSHIP VIRTUAL VISIT:  I connected with Kathy Sherman on 06/21/18 at  3:00 PM EDT by telephone and verified that I am speaking with the correct person using two identifiers.   I discussed the limitations, risks, security and privacy concerns of performing an evaluation and management service by telephone and the availability of in person appointments. I also discussed with the patient that there may be a patient responsible charge related to this service. The patient expressed understanding and agreed to proceed.     REASON FOR VISIT:  Routine follow-up for history of breast cancer.   BRIEF ONCOLOGIC HISTORY:    Cancer of central portion of left female breast ([place])   11/24/2013 Initial Biopsy    DCIS with calcifications and necrosis ER 0%, PR 0%    12/01/2013 Breast MRI    Left breast upper central portion 2.2 cm enhancement; incidental note of right renal cyst 2 cm    12/19/2013 Surgery    Left breast lumpectomy: High-grade DCIS with comedonecrosis 1.5 cm margins negative ER 0% PR 0%     02/05/2014 - 03/02/2014 Radiation Therapy    Left breast radiation therapy completed. 17 fractions, total dose: 4250 cGy.      INTERVAL HISTORY:  Kathy Sherman [facility] for routine follow-up for her history of breast cancer.  Overall, she reports feeling quite well.   Since her last visit, she underwent bilateral breast diagnostic mammogram on 11/12/2017 that showed no evidence of malignancy, and breast density category C. This morning she underwent her annual wellness physical this morning and it went well.  She had a panel of labs drawn as well.  She saw the NP in the clinic.    Serah has had a good year.  She has no health changes in the past year.  She is up to date with her cancer screenings.  She sees gynecology regularly.  She is exercising regularly with walking for 45 minutes per day.  She also enjoys gardening.  Intisar is not following a particular diet at  this point.      REVIEW OF SYSTEMS:  Review of Systems  Constitutional: Negative for appetite change, chills, fatigue, fever and unexpected weight change.  HENT:   Negative for hearing loss, lump/mass, mouth sores, sore throat and trouble swallowing.   Eyes: Negative for eye problems and icterus.  Respiratory: Negative for chest tightness, cough and shortness of breath.   Cardiovascular: Negative for chest pain, leg swelling and palpitations.  Gastrointestinal: Negative for abdominal distention, abdominal pain, blood in stool, constipation, diarrhea, nausea and vomiting.  Endocrine: Negative for hot flashes.  Genitourinary: Negative for difficulty urinating.   Musculoskeletal: Negative for arthralgias.  Skin: Negative for itching and rash.  Neurological: Negative for dizziness, extremity weakness, headaches and numbness.  Hematological: Negative for adenopathy. Does not bruise/bleed easily.  Psychiatric/Behavioral: Negative for depression. The patient is not nervous/anxious.   Breast: Denies any new nodularity, masses, tenderness, nipple changes, or nipple discharge.       PAST MEDICAL/SURGICAL HISTORY:  Past Medical History:  Diagnosis Date  . Breast cancer ([place])   . Personal history of radiation therapy 02/16/2014  . PONV (postoperative nausea and vomiting)   . S/P radiation therapy 02/05/14-03/02/14   left breast/4250Gy/17 sessions  . Wears glasses   . Wears partial dentures    top   Past Surgical History:  Procedure Laterality Date  . BREAST BIOPSY Left 11/24/2013  . BREAST LUMPECTOMY Left 12/19/2013  . BREAST LUMPECTOMY  WITH RADIOACTIVE SEED LOCALIZATION Left 12/19/2013   Procedure: LEFT BREAST LUMPECTOMY WITH RADIOACTIVE SEED LOCALIZATION;  Surgeon: Alphonsa Overall, MD;  Location: [facility];  Service: General;  Laterality: Left;  Marland Kitchen MULTIPLE TOOTH EXTRACTIONS     top  . ORIF WRIST FRACTURE  4/15   left     ALLERGIES:  No Known Allergies   CURRENT  MEDICATIONS:  Outpatient Encounter Medications as of 06/21/2018  Medication Sig  . vitamin C (ASCORBIC ACID) 500 MG tablet Take 500 mg by mouth daily.   No facility-administered encounter medications on file as of 06/21/2018.      ONCOLOGIC FAMILY HISTORY:  Family History  Problem Relation Age of Onset  . Hypertension Brother     GENETIC COUNSELING/TESTING:not at this time  SOCIAL HISTORY:  Social History   Socioeconomic History  . Marital status: Married    Spouse name: Not on file  . Number of children: Not on file  . Years of education: Not on file  . Highest education level: Not on file  Occupational History  . Not on file  Social Needs  . Financial resource strain: Not on file  . Food insecurity:    Worry: Not on file    Inability: Not on file  . Transportation needs:    Medical: Not on file    Non-medical: Not on file  Tobacco Use  . Smoking status: Never Smoker  . Smokeless tobacco: Never Used  Substance and Sexual Activity  . Alcohol use: No  . Drug use: No  . Sexual activity: Not on file    Comment: menopause 05/2009, no HRT  Lifestyle  . Physical activity:    Days per week: Not on file    Minutes per session: Not on file  . Stress: Not on file  Relationships  . Social connections:    Talks on phone: Not on file    Gets together: Not on file    Attends religious service: Not on file    Active member of club or organization: Not on file    Attends meetings of clubs or organizations: Not on file    Relationship status: Not on file  . Intimate partner violence:    Fear of current or ex partner: Not on file    Emotionally abused: Not on file    Physically abused: Not on file    Forced sexual activity: Not on file  Other Topics Concern  . Not on file  Social History Narrative  . Not on file      OBJECTIVE:  [place] sounds  LABORATORY DATA:  None for this visit   DIAGNOSTIC IMAGING:  Most recent mammogram:      ASSESSMENT AND PLAN:  Ms..  Strayer is a pleasant 63 y.o. female with history of Stage 0 left breast DCIS, ER-/PR-, diagnosed in 11/2013, treated with lumpectomy and adjuvant radiation therapy.  She [facility] for surveillance and routine follow-up.   1. History of breast cancer:  Ms. Pfeifer is currently clinically and radiographically without evidence of disease or recurrence of breast cancer. She will be due for mammogram in 10/2018; orders placed today.  We reviewed that at this point she can graduate from the cancer center.  She will f/u with GYN from now on for her annual breast exams and we are happy to see her at any point in the future as the need may arise  2. Bone health:  Given Ms. Skiff's age, history of  breast cancer, she is at risk for bone demineralization.   She was given education on specific food and activities to promote bone health.  3. Cancer screening:  Due to Ms. Gitlin's history and her age, she should receive screening for skin cancers, colon cancer, and gynecologic cancers. She was encouraged to follow-up with her PCP for appropriate cancer screenings.   4. Health maintenance and wellness promotion: Ms. Kilgour was encouraged to consume 5-7 servings of fruits and vegetables per day. She was also encouraged to engage in moderate to vigorous exercise for 30 minutes per day most days of the week. She was instructed to limit her alcohol consumption and continue to abstain from tobacco use.    Follow up instructions:    -Return to cancer center PR  -Mammogram due in 10/2018    The patient was provided an opportunity to ask questions and all were answered. The patient agreed with the plan and demonstrated an understanding of the instructions.   The patient was advised to call back or seek an in-person evaluation if the symptoms worsen or if the condition fails to improve as anticipated.   I provided 11 minutes of non face-to-face telephone visit time during this encounter, and >  50% was spent counseling as documented under my assessment & plan.   Gardenia Phlegm, NP Survivorship Program [facility] [phone]   Note: PRIMARY CARE PROVIDER Ernestene Kiel, [place] [phone]

## 2018-11-15 ENCOUNTER — Other Ambulatory Visit: Payer: Self-pay

## 2018-11-15 ENCOUNTER — Ambulatory Visit
Admission: RE | Admit: 2018-11-15 | Discharge: 2018-11-15 | Disposition: A | Discharge status: Home / Self Care | Payer: 59 | Source: Ambulatory Visit | Attending: Adult Health | Admitting: Adult Health

## 2018-11-15 DIAGNOSIS — Z171 Estrogen receptor negative status [ER-]: Secondary | ICD-10-CM

## 2018-11-15 DIAGNOSIS — C50112 Malignant neoplasm of central portion of left female breast: Secondary | ICD-10-CM

## 2019-10-20 ENCOUNTER — Other Ambulatory Visit: Payer: Self-pay | Admitting: Internal Medicine

## 2019-10-20 DIAGNOSIS — Z1231 Encounter for screening mammogram for malignant neoplasm of breast: Secondary | ICD-10-CM

## 2019-11-21 ENCOUNTER — Ambulatory Visit
Admission: RE | Admit: 2019-11-21 | Discharge: 2019-11-21 | Disposition: A | Discharge status: Home / Self Care | Payer: PRIVATE HEALTH INSURANCE | Source: Ambulatory Visit | Attending: Internal Medicine | Admitting: Internal Medicine

## 2019-11-21 ENCOUNTER — Other Ambulatory Visit: Payer: Self-pay

## 2019-11-21 DIAGNOSIS — Z1231 Encounter for screening mammogram for malignant neoplasm of breast: Secondary | ICD-10-CM

## 2020-02-20 ENCOUNTER — Other Ambulatory Visit: Payer: Self-pay

## 2020-02-20 ENCOUNTER — Emergency Department (HOSPITAL_COMMUNITY)
Admission: EM | Admit: 2020-02-20 | Discharge: 2020-02-20 | Disposition: A | Discharge status: Home / Self Care | Payer: PRIVATE HEALTH INSURANCE | Attending: Emergency Medicine | Admitting: Emergency Medicine

## 2020-02-20 ENCOUNTER — Emergency Department (HOSPITAL_COMMUNITY): Payer: PRIVATE HEALTH INSURANCE

## 2020-02-20 DIAGNOSIS — S8011XA Contusion of right lower leg, initial encounter: Secondary | ICD-10-CM | POA: Diagnosis not present

## 2020-02-20 DIAGNOSIS — Z853 Personal history of malignant neoplasm of breast: Secondary | ICD-10-CM | POA: Insufficient documentation

## 2020-02-20 DIAGNOSIS — Y92410 Unspecified street and highway as the place of occurrence of the external cause: Secondary | ICD-10-CM | POA: Diagnosis not present

## 2020-02-20 DIAGNOSIS — R000 Tachycardia, unspecified: Secondary | ICD-10-CM | POA: Insufficient documentation

## 2020-02-20 DIAGNOSIS — M545 Low back pain, unspecified: Secondary | ICD-10-CM | POA: Insufficient documentation

## 2020-02-20 DIAGNOSIS — S8991XA Unspecified injury of right lower leg, initial encounter: Secondary | ICD-10-CM | POA: Diagnosis present

## 2020-02-20 MED ORDER — ACETAMINOPHEN 325 MG PO TABS
650.0000 mg | ORAL_TABLET | Freq: Once | ORAL | Status: AC
  Administered 2020-02-20: 650 mg via ORAL
  Filled 2020-02-20: qty 2

## 2020-02-20 NOTE — Discharge Instructions (Signed)
Take Tylenol as needed for the next week. Take this medicine with food. Use a heating pad for sore muscles - use for 20 minutes several times a day Try gentle range of motion exercises Please follow-up with your primary care doctor if your symptoms not improve. Return for worsening symptoms
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
## 2020-11-02 ENCOUNTER — Other Ambulatory Visit: Payer: Self-pay | Admitting: Internal Medicine

## 2020-11-02 DIAGNOSIS — Z1231 Encounter for screening mammogram for malignant neoplasm of breast: Secondary | ICD-10-CM

## 2020-12-03 ENCOUNTER — Ambulatory Visit
Admission: RE | Admit: 2020-12-03 | Discharge: 2020-12-03 | Disposition: A | Discharge status: Home / Self Care | Payer: PRIVATE HEALTH INSURANCE | Source: Ambulatory Visit | Attending: Internal Medicine | Admitting: Internal Medicine

## 2020-12-03 ENCOUNTER — Other Ambulatory Visit: Payer: Self-pay

## 2020-12-03 DIAGNOSIS — Z1231 Encounter for screening mammogram for malignant neoplasm of breast: Secondary | ICD-10-CM

## 2021-02-08 ENCOUNTER — Other Ambulatory Visit: Payer: Self-pay | Admitting: Obstetrics and Gynecology

## 2021-02-08 DIAGNOSIS — M8580 Other specified disorders of bone density and structure, unspecified site: Secondary | ICD-10-CM

## 2021-07-18 ENCOUNTER — Ambulatory Visit
Admission: RE | Admit: 2021-07-18 | Discharge: 2021-07-18 | Disposition: A | Discharge status: Home / Self Care | Payer: Managed Care, Other (non HMO) | Source: Ambulatory Visit | Attending: Obstetrics and Gynecology | Admitting: Obstetrics and Gynecology

## 2021-07-18 DIAGNOSIS — M8580 Other specified disorders of bone density and structure, unspecified site: Secondary | ICD-10-CM

## 2021-12-05 ENCOUNTER — Other Ambulatory Visit: Payer: Self-pay | Admitting: Obstetrics and Gynecology

## 2021-12-05 DIAGNOSIS — Z1231 Encounter for screening mammogram for malignant neoplasm of breast: Secondary | ICD-10-CM

## 2022-02-06 ENCOUNTER — Ambulatory Visit
Admission: RE | Admit: 2022-02-06 | Discharge: 2022-02-06 | Disposition: A | Discharge status: Home / Self Care | Payer: Managed Care, Other (non HMO) | Source: Ambulatory Visit | Attending: Obstetrics and Gynecology | Admitting: Obstetrics and Gynecology

## 2022-02-06 DIAGNOSIS — Z1231 Encounter for screening mammogram for malignant neoplasm of breast: Secondary | ICD-10-CM
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
## 2022-12-27 ENCOUNTER — Other Ambulatory Visit: Payer: Self-pay | Admitting: Internal Medicine

## 2022-12-27 DIAGNOSIS — Z1231 Encounter for screening mammogram for malignant neoplasm of breast: Secondary | ICD-10-CM

## 2023-02-09 ENCOUNTER — Ambulatory Visit
Admission: RE | Admit: 2023-02-09 | Discharge: 2023-02-09 | Disposition: A | Discharge status: Home / Self Care | Payer: Managed Care, Other (non HMO) | Source: Ambulatory Visit | Attending: Internal Medicine | Admitting: Internal Medicine

## 2023-02-09 DIAGNOSIS — Z1231 Encounter for screening mammogram for malignant neoplasm of breast: Secondary | ICD-10-CM

## 2023-02-12 ENCOUNTER — Other Ambulatory Visit: Payer: Self-pay | Admitting: Obstetrics and Gynecology

## 2023-02-12 DIAGNOSIS — M810 Age-related osteoporosis without current pathological fracture: Secondary | ICD-10-CM

## 2023-09-03 ENCOUNTER — Ambulatory Visit
Admission: RE | Admit: 2023-09-03 | Discharge: 2023-09-03 | Disposition: A | Discharge status: Home / Self Care | Source: Ambulatory Visit | Attending: Obstetrics and Gynecology | Admitting: Obstetrics and Gynecology

## 2023-09-03 DIAGNOSIS — M810 Age-related osteoporosis without current pathological fracture: Secondary | ICD-10-CM | POA: Insufficient documentation

## 2023-10-11 ENCOUNTER — Other Ambulatory Visit: Payer: Managed Care, Other (non HMO)

## 2024-01-11 ENCOUNTER — Other Ambulatory Visit: Payer: Self-pay | Admitting: Internal Medicine

## 2024-01-11 DIAGNOSIS — Z1231 Encounter for screening mammogram for malignant neoplasm of breast: Secondary | ICD-10-CM

## 2024-02-11 ENCOUNTER — Ambulatory Visit

## 2024-02-13 ENCOUNTER — Ambulatory Visit

## 2024-02-13 ENCOUNTER — Ambulatory Visit
Admission: RE | Admit: 2024-02-13 | Discharge: 2024-02-13 | Disposition: A | Source: Ambulatory Visit | Attending: Internal Medicine | Admitting: Internal Medicine

## 2024-02-13 DIAGNOSIS — Z1231 Encounter for screening mammogram for malignant neoplasm of breast: Secondary | ICD-10-CM
